# Patient Record
Sex: Female | Born: 1990 | Race: Black or African American | Hispanic: No | Marital: Single | Smoking: Never smoker
Health system: Southern US, Academic
[De-identification: ages and names within clinical notes are randomized; demographics above are authoritative.]

## PROBLEM LIST (undated history)

## (undated) DIAGNOSIS — N760 Acute vaginitis: Secondary | ICD-10-CM

## (undated) DIAGNOSIS — J45909 Unspecified asthma, uncomplicated: Secondary | ICD-10-CM

## (undated) DIAGNOSIS — Z789 Other specified health status: Secondary | ICD-10-CM

## (undated) HISTORY — DX: Other specified health status: Z78.9

## (undated) HISTORY — DX: Unspecified asthma, uncomplicated: J45.909

## (undated) HISTORY — PX: HX TONSILLECTOMY: SHX27

## (undated) HISTORY — DX: Acute vaginitis: N76.0

## (undated) HISTORY — PX: HX WISDOM TEETH EXTRACTION: SHX21

---

## 2011-08-03 ENCOUNTER — Ambulatory Visit (INDEPENDENT_AMBULATORY_CARE_PROVIDER_SITE_OTHER)

## 2011-08-03 ENCOUNTER — Encounter (INDEPENDENT_AMBULATORY_CARE_PROVIDER_SITE_OTHER): Payer: Self-pay

## 2011-08-03 VITALS — BP 133/93 | HR 86 | Temp 97.5°F | Resp 16 | Wt 274.9 lb

## 2011-08-03 MED ORDER — NYSTATIN-TRIAMCINOLONE 100,000 UNIT/GRAM-0.1 % TOPICAL OINTMENT
TOPICAL_OINTMENT | Freq: Two times a day (BID) | CUTANEOUS | Status: DC
Start: 2011-08-03 — End: 2011-09-02

## 2011-08-03 NOTE — Patient Instructions (Signed)
[facility]  [facility]  [address], [address]  [place], [place] [address]  Phone: [phone]  Fax: [phone]  [email or web address]  ________________________________________________________________________           Open Daily 8:00am - 8:00pm     ~     Closed Thanksgiving and Christmas Day  ________________________________________________________________________    Attending Caregiver:  Janeece Riggers. Aundria Rud, MD    Prescription(s) E-Rx to:  [facility] - [place], [place] - [address] AT [address]]    Orders Placed This Encounter   . Nystatin-Triamcinolone (MYCOLOG) 100,000-0.1 unit/gram-% Apply externally Ointment           Candidiasis, Fungal Infections,   Ringworm, Yeast Infections  Ringworm infection is a fungal infection. It usually infects the skin and mucous membranes (like the membranes that line the mouth). It has nothing to do with worms. A fungus is an organism (kind of like a germ) that lives on dead cells (the outer layer of skin, hair and nails). It does not go deeper than the surface of the skin. It can involve the entire body. It can spread from infected humans, soil and pets. The medical term for this fungus is Tinea. Tinea is a group of infections of the skin, nails or hair caused by fungal organisms. They often cause a red, round, itchy rash. Types are named by location and include an infection of the skin (Tinea versicolor), ringworm (Tinea corporis), athlete's foot (Tinea pedis), ringworm of the scalp (Tinea capitis) and jock itch (Tinea cruris).    Infections usually grow on our bodies where it is warm and moist. This is the reason it is found under the breasts, in skin folds and between the toes, among other places. This organism is normally found on our skin and only causes problems when it starts to overgrow in conditions it favors or likes. Candidiasis is commonly found in the vagina.  SYMPTOMS   Itching.    Rash.     Irritation.    Swelling.    Blisters.    Cracking and peeling of the skin.    Thick, dry, ragged and discolored toenails.   Intertriginous infections, like those infections under the breast and in skin folds, are the most common type. These also occur under the arms, and in the groin or belly button (umbilicus). The infection usually shows up as a red itchy rash which is often weepy and damp.  SOME OF THE CAUSES OF THESE INFECTIONS ARE:   Antibiotics. These kill the normal germs (bacteria) on the skin, so yeasts can overgrow.    Steroid use. This weakens (depresses) the immune response or the body's ability to fight infection.    Being very overweight (obese).    Poor immune state and problems with the immune system.    Immunosuppressive therapy. This includes cancer or organ transplant drugs.    Pregnancy.    Diabetes mellitus and other glandular problems or serious medical illness.    Going barefoot in public locker rooms, saunas and showers.    Contact from pets.    Contact from other infected humans.    Contact with infected soil.    AIDS.   DIAGNOSIS  Your caregiver can usually diagnose this infection on exam. Sometimes, scrapings of the skin are done to look for the cause under a microscope or to get cultures of the germ.  TREATMENT     Over-the-counter (OTC) medications are available for  treating this problem.    It is important to keep the areas clean and dry. Getting rid of the conditions which allowed the fungus to start growing is often a cure or fix.    Areas which have lost skin color (pigment) may not get color back until the fungus is clear.    For frequent or recurrent candidiasis infections of the vagina, use a anti fungus vaginal suppository or vaginal cream 2 times a week, or as directed.   HOME CARE INSTRUCTIONS    Fungal infections may be treated with over-the-counter medications, topical creams, ointments or oral medications. Ask your caregiver or pharmacist. Call your caregiver if your OTC treatment is not working.    If you are using a cream or ointment, wash infected skin and dry it completely before applying the cream.    If your pet has the same infection, have it treated by your veterinarian.    Do not wear other people's cloths or shoes.    Keep your feet, areas under your breasts, your belly button and groin areas dry.    Wear cotton or wool socks.    Do not wear tight clothing.    Change your socks often if your feet sweat.    Do not wear shoes made of vinyl or rubber.   The infection may return because the causative organism is normally found on the skin or the treatment was not strong or long enough.    SEEK MEDICAL CARE IF:   The ringworm patch (fungus) continues to spread after 7 days of treatment.    The rash is not gone in 4 weeks. Fungal infections are slow to respond to treatment. Some redness (erythema) may remain for several weeks after the fungus is gone.    The area becomes red, warm, tender and swollen beyond the patch. (This may be a secondary bacterial infection).    An oral temperature above 101 develops.    You develop painful urination.    You develop pain with sexual intercourse.    You develop blisters in the area of the infection.    You develop intestinal problems.    You develop vaginal bleeding and it is not time for your menstrual period.   MAKE SURE YOU:     Understand these instructions.    Will watch your condition.    Will get help right away if you are not doing well or get worse.   Document Released: 01/25/2004 Document Re-Released: 09/01/2009  [facility] Patient Information 2012 [place], [place].

## 2011-08-03 NOTE — Progress Notes (Signed)
History of Present Illness: Erin Russo is a 20 y.o. female who presents to the Urgent Care today complaining of a rash. It is under her right breast and started about a week ago.  Thought it was just irritated from her bra.  She has tried baby powder without improvement.  Seems to be getting worse.  She works out 2-3x/week.    I reviewed and confirmed the patient's past medical history taken by the nurse or medical assistant (see cosignature of written/scanned note) with the addition of the following:    Past Medical History:    Past Medical History   Diagnosis Date   . Asthma        Past Surgical History:   Past Surgical History   Procedure Date   . Hx tonsillectomy    . Hx wisdom teeth extraction        Allergies: No Known Allergies    Medications:   No outpatient prescriptions prior to visit.    Social History:  History     Social History   . Marital Status: Single     Spouse Name: N/A     Number of Children: N/A   . Years of Education: N/A     Social History Main Topics   . Smoking status: Never Smoker    . Smokeless tobacco: Not on file   . Alcohol Use: Not on file   . Drug Use: Not on file   . Sexually Active: Not on file     Other Topics Concern   . Not on file     Social History Narrative   . No narrative on file       Family History: No family history on file.    Review of Systems:    General: no fever  Skin: no other rash  Endo: has gained weight since stopped playing basketball. Having trouble getting it off.    Physical Exam:  Vital signs: Review on written chart.  See nurses note.  Filed Vitals:    08/03/11 1050   BP: 133/93   Pulse: 86   Temp: 36.4 C (97.5 F)   TempSrc: Tympanic   Resp: 16   Weight: 124.7 kg (274 lb 14.6 oz)   SpO2: 99%     General: Well-appearing. No apparent distress.  Eyes: Normal lids and lashes. Normal conjunctiva.  Skin: hyperpigmented, large patch under right breast.  Round and well demarcated.    Point-of-care testing: n/a     Differential diagnosis: tinea corporis    Assessment Plan:      1. Tinea corporis (110.5)      Orders Placed This Encounter   . Nystatin-Triamcinolone (MYCOLOG) 100,000-0.1 unit/gram-% Apply externally Ointment       Plan was discussed and patient verbalized understanding.  If symptoms are worsening or not improving the patient should return to the Urgent Care for further evaluation.    Janifer Adie, MD 08/03/2011, 11:07 AM

## 2011-08-27 ENCOUNTER — Ambulatory Visit (INDEPENDENT_AMBULATORY_CARE_PROVIDER_SITE_OTHER)

## 2011-08-27 ENCOUNTER — Encounter (INDEPENDENT_AMBULATORY_CARE_PROVIDER_SITE_OTHER): Payer: Self-pay

## 2011-08-27 VITALS — BP 138/85 | HR 84 | Temp 97.9°F | Resp 18 | Wt 274.5 lb

## 2011-08-27 NOTE — Patient Instructions (Signed)
 [facility]  [facility]  [address]  [place], [place] [address]  Phone: [phone]  Fax: [phone]  [email or web address]  ________________________________________________________________________           Open Daily 8:00am - 8:00pm     ~     Closed Thanksgiving and Christmas Day  ________________________________________________________________________    Attending Caregiver:  Oneil BRAVO. Sharl, MD    Prescription(s) E-Rx to:  [facility] - [place], [place] - [address] AT [address]]    Orders Placed This Encounter   . AMB CONSULT/REFERRAL PHYS THERAPY- EXTERNAL             Costochondritis  (Costochondral Separation / Tietze Syndrome)  Costochondritis (Tietze syndrome), or costochondral separation, is a swelling and irritation (inflammation) of the tissue (cartilage) that connects your ribs with your breastbone (sternum). It may occur on its own (spontaneously), through damage caused by an accident (trauma), or simply from coughing or minor exercise. It may take up to 6 weeks to get better and longer if you are unable to be conservative in your activities.  HOME CARE INSTRUCTIONS   Avoid exhausting physical activity. Try not to strain your ribs during normal activity. This would include any activities using chest, belly (abdominal) and side muscles, especially if heavy weights are used.    Use ice for 15 minutes per hour while awake for the first 2 days. Place the ice in a plastic bag, and place a towel between the bag of ice and your skin.    Only take over-the-counter or prescription medicines for pain, discomfort, or fever as directed by your caregiver.   SEEK IMMEDIATE MEDICAL CARE IF:   Your pain increases or you are very uncomfortable.    An oral temperature above 101 develops.    You develop difficulty with your breathing.    You cough up blood.    You develop worse chest pains, shortness of breath, sweating, or  vomiting.    You develop new, unexplained problems (symptoms).   MAKE SURE YOU:     Understand these instructions.    Will watch your condition.    Will get help right away if you are not doing well or get worse.   Document Released: 08/14/2005 Document Re-Released: 01/29/2010  [facility] Patient Information 2012 [place], [place].

## 2011-08-30 NOTE — Progress Notes (Signed)
History of Present Illness: Erin Russo is a 20 y.o. female who presents to the Urgent Care today complaining of twinges of pain intermittently, lasting only a few seconds and occuring infrequently in left chest wall below her left breast.  Always in the same spot.  Well localized to fingertip area of pain.    I reviewed and confirmed the patient's past medical history taken by the nurse or medical assistant with the addition of the following:    Past Medical History   Diagnosis Date   . Asthma      Past Surgical History   Procedure Date   . Hx tonsillectomy    . Hx wisdom teeth extraction      No Known Allergies    Outpatient Prescriptions Prior to Visit:  NORGESTIMATE-ETHINYL ESTRADIOL (ORTHO TRI-CYCLEN, 28, ORAL) take  by mouth.     Nystatin-Triamcinolone (MYCOLOG) 100,000-0.1 unit/gram-% Apply externally Ointment Apply  topically Twice daily.     History     Social History   . Marital Status: Single     Spouse Name: N/A     Number of Children: N/A   . Years of Education: N/A     Social History Main Topics   . Smoking status: Never Smoker    . Smokeless tobacco: Not on file   . Alcohol Use: Yes   . Drug Use: Not on file   . Sexually Active: Not on file     Other Topics Concern   . Not on file     Social History Narrative   . No narrative on file     Family History   Problem Relation Age of Onset   . Healthy Mother    . Healthy Father        Review of Systems:  Constitutional:  No fever  HEENT:  No st  Respiratory: no cough  Cardiovascular:  No palp  GI:  No n/v/d or abd pain  Skin:  Rash improved from prior visit.    All other review of systems are NEGATIVE.    Physical Exam:  Vital signs:   Filed Vitals:    08/27/11 1626   BP: 138/85   Pulse: 84   Temp: 36.6 C (97.9 F)   TempSrc: Tympanic   Resp: 18   Weight: 124.5 kg (274 lb 7.6 oz)   SpO2: 98%     Constitutional:  Well-appearing, in no apparent distress   ENT: Normal external auditory canals bilaterally. Normal tympanic membranes without erythema or effusion. Oropharynx is clear, no erythema or exudate.  Neck: Supple, no lymphadenopathy, no thyromegaly.  Pulmonary: Lungs are clear to auscultation bilaterally, no wheezes, no rales, no rhonchi.  Cardiovascular: Regular rate and rhythm, normal S1 and S2, no murmur, no rub, no gallop.  Chest wall:  Point tender over 5th rib costochondral joint on the left.     Medical Decision Making: costochondritis v subluxation     Assessment and Plan: PT refer, NSAIDs, ice.    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 AMB CONSULT/REFERRAL PHYS THERAPY- EXTERNAL       Plan was discussed and patient verbalized understanding.  If symptoms are worsening or not improving the patient should return to the Urgent Care for further evaluation.    Janifer Adie, MD 08/30/2011, 5:11 PM

## 2011-09-02 ENCOUNTER — Ambulatory Visit (INDEPENDENT_AMBULATORY_CARE_PROVIDER_SITE_OTHER)

## 2011-09-02 ENCOUNTER — Encounter (INDEPENDENT_AMBULATORY_CARE_PROVIDER_SITE_OTHER): Payer: Self-pay

## 2011-09-02 VITALS — BP 124/74 | HR 83 | Temp 97.3°F | Wt 270.5 lb

## 2011-09-02 MED ORDER — ALBUTEROL SULFATE HFA 90 MCG/ACTUATION AEROSOL INHALER
2.0000 | INHALATION_SPRAY | RESPIRATORY_TRACT | Status: AC | PRN
Start: 2011-09-02 — End: ?

## 2011-09-02 NOTE — Patient Instructions (Addendum)
 Asthma, Adult  Asthma is caused by narrowing of the air passages in the lungs. It may be triggered by pollen, dust, animal dander, molds, some foods, respiratory infections, exposure to smoke, exercise, emotional stress or other allergens (things that cause allergic reactions or allergies). Repeat attacks are common.  HOME CARE INSTRUCTIONS   Use prescription medications as ordered by your caregiver.    Avoid pollen, dust, animal dander, molds, smoke and other things that cause attacks at home and at work.    You may have fewer attacks if you decrease dust in your home. Electrostatic air cleaners may help.    It may help to replace your pillows or mattress with materials less likely to cause allergies.    Talk to your caregiver about an action plan for managing asthma attacks at home, including, the use of a peak flow meter which measures the severity of your asthma attack. An action plan can help minimize or stop the attack without having to seek medical care.    If you are not on a fluid restriction, drink 8 to 10 glasses of water each day.    Always have a plan prepared for seeking medical attention, including, calling your physician, accessing local emergency care, and calling 911 (in the [place] for a severe attack.    Discuss possible exercise routines with your caregiver.    If animal dander is the cause of asthma, you may need to get rid of pets.   SEEK MEDICAL CARE IF:   You have wheezing and shortness of breath even if taking medicine to prevent attacks.    An oral temperature above 101 F develops.    You have muscle aches, chest pain or thickening of sputum.    Your sputum changes from clear or white to yellow, green, gray or bloody.    You have any problems that may be related to the medicine you are taking (such as a rash, itching, swelling or trouble breathing).   SEEK IMMEDIATE MEDICAL CARE IF:   Your usual medicines do not stop your wheezing or there is increased coughing and/or  shortness of breath.    You have increased difficulty breathing.    You have an oral temperature above 101 F, not controlled by medicine.   MAKE SURE YOU:   Understand these instructions.    Will watch your condition.    Will get help right away if you are not doing well or get worse.   Document Released: 11/04/2005 Document Re-Released: 11/26/2009  [employer] Patient Information 2012 [place] BIJOU.       [facility]   [facility]  [address]  [place], [place] [address]  Phone: [phone]  Fax: [phone]  [email or web address]               Open Daily 8:00am - 8:00pm ~ Closed Thanksgiving and Christmas Day     Attending Caregiver: Raford, PA-C/ Loralee, MD    Today's orders:   Orders Placed This Encounter   . albuterol  sulfate (PROAIR  HFA) 90 mcg/actuation Inhalation HFA Aerosol Inhaler        Prescription(s) E-Rx to:  [facility] - [place], [place] - [address] AT [address]    ________________________________________________________________________  Short Term Disability and Family Medical Leave Act  [facility] does NOT provide assistance with any disability applications.  If you feel your medical condition requires you to be on disability, you will need to  follow up with  Your primary care physician or a specialist.  We apologize for any inconvenience.    For Medication Prescribed by [facility]:  As an Urgent Care facility, our clinic does NOT offer prescription refills over the telephone.    If you need more of the medication one of our medical providers prescribed, you will  Either need to be re-evaluated by us  or see your primary care physician.    ________________________________________________________________________      It is very important that we have a phone number that is the single best way to contact you in the event that we become aware of important clinical information or concerns after your  discharge.  If the phone number you provided at registration is NOT this number you should inform staff and registration prior to leaving.      Your treatment and evaluation today was focused on identifying and treating potentially emergent conditions based on your presenting signs, symptoms, and history.  The resulting initial clinical impression and treatment plan is not intended to be definitive or a substitute for a full physical examination and evaluation by your primary care provider.  If your symptoms persist, worsen, or you develop any new or concerning symptoms, you need to be evaluated.      If you received x-rays during your visit, be aware that the final and formal interpretation of those films by a radiologist may occur after your discharge.  If there is a significant discrepancy identified after your discharge, we will contact you at th telephone number provided at registration.      If you received a pelvic exam, you may have cultures pending for sexually transmitted diseases.  Positive cultures are reported to the [facility] Department of Health as required by state law.  You should be contacted if you cultures are positive.  We will not contact you if they are negative.  You may contact the Health Information Management Office of [facility] to get a copy of your results.  You did NOT receive a PAP smear (the screening test for cervical).  This specific test for women is best performed by your gynecologist or primary care provider when indicated.      If you are over 20 year old, we cannot discuss your personal health information with a parent, spouse, family member, or anyone else without your express consent.  This does not include those who have legitimate access to your records and information to assist in your care under the provisions of HIPAA [facility] Portability and Accountability Act) law, or those to whom you have previously given express written consent to do so, such a legal  guardian or Power of [place].      You may have received medication that may cause you to feel drowsy and/or light headed for several hours.  You may even experience some amnesia of your stay.  You should avoid operating a motor vehicle or performing any activity requiring complete alertness or coordination until you feel fully awake (approximately 24-48 hours).  Avoid alcoholic beverages.  You may also have a dry mouth for several hours.  This is a normal side effect and will disappear as the effects of the medication wear off.      Instructions discussed with patient upon discharge by clinical staff with all questions answered.  Please call [facility] [phone]) if any further questions.  Go immediately to the emergency department if any concern or worsening symptoms.  Caron Scarce, [place] 09/02/2011, 6:26 PM

## 2011-09-02 NOTE — Progress Notes (Signed)
[place] [facility]  [facility]  [address]  [place] [address]  [phone]      Name:  Teona Vargus  MRN:  [id number]  DOB:  11/02/1991  DATE:  09/02/2011    History of Present Illness: Bren Borys is a 20 y.o. female who presents to the Urgent Care today complaining of Asthma  .  She reports to urgent care today for a refill of her albuterol inhaler.  Patient denies any recent asthma attacks but is concerned with the weather turning colder that it will affect her breathing.  She denies any chest pain or SOB.  She [facility]er [facility].  At this time, she is currently without any symptoms.  She denies any smoking.  Patient reports that asthma is well controlled with albuterol inhaler.  She denies any other regular medications.    Location: respiratory  Quality: asthma  Onset: no acute symptoms  Severity: Mild  Timing: intermittent  Context: Patient denies any pain  Modifying factors: h/o asthma  Associated symptoms: no current symptoms    I reviewed and confirmed the patient's past medical history taken by the nurse or medical assistant with the addition of the following:    Past Medical History:  Past Medical History   Diagnosis Date   . Asthma        Past Surgical History:  Past Surgical History   Procedure Date   . Hx tonsillectomy    . Hx wisdom teeth extraction        Allergies:  No Known Allergies    Medications:    Outpatient Prescriptions Prior to Visit:  NORGESTIMATE-ETHINYL ESTRADIOL (ORTHO TRI-CYCLEN, 28, ORAL) take  by mouth.     Nystatin-Triamcinolone (MYCOLOG) 100,000-0.1 unit/gram-% Apply externally Ointment Apply  topically Twice daily.       Social History:  Occupation: Interior and spatial designer)    History   Substance Use Topics   . Smoking status: Never Smoker    . Smokeless tobacco: Not on file   . Alcohol Use: Yes       Family History:  Family History   Problem Relation Age of Onset    . Healthy Mother    . Healthy Father        Review of Systems:  General:  Denies any fever, myalgias or fatigue  Pulmonary: Denies cough, wheezing, shortness of breath, hemoptysis  Cardiovascular: Denies chest pain or palpatations  LMP: 3 weeks ago  Neuro:  Denies parasthesias, weakness, headache, LOC or seizures      Physical Exam:  Vital signs:   Filed Vitals:    09/02/11 1748   BP: 124/74   Pulse: 83   Temp: 36.3 C (97.3 F)   TempSrc: Tympanic   Weight: 122.7 kg (270 lb 8.1 oz)   SpO2: 97%       General:  Patient is well-appearing and no acute distress  Pulmonary: lungs clear to ausciltation bilaterally, no wheezes appreciated  CV: heart has a regular, rate and rhythm without murmur; normal S1/S2  Skin/ Wound: skin is warm and dry with no evidence of rash  Psych: appropriate mood and behavior for age  Neuro: patient is alert and oriented x 3    Data Reviewed  Not applicable    Course: Condition at discharge: Good     Differential Diagnosis: Asthma    Assessment:   1. Asthma        Plan:  Orders Placed This Encounter   . albuterol sulfate (PROAIR HFA) 90 mcg/actuation Inhalation HFA Aerosol Inhaler      Plan was discussed and patient verbalized understanding.  If symptoms are worsening or not improving the patient should return to the Urgent Care for further evaluation.     Patient given a script for Albuterol; encouraged her to monitor symptoms and return if any problems with breathing    Patient understood and agreed with plan; She understands to return to ED or UC if symptoms progress or get worse    The co-signing supervising physician was physically present in Urgent Care and available for consultation and did not participate in the care of this patient.    This patient was seen in clinic while Dr. Trudie Reed was the supervising physician.        Junie Bame, [place] 09/02/2011, 6:28 PM

## 2012-01-26 ENCOUNTER — Emergency Department
Admission: EM | Admit: 2012-01-26 | Discharge: 2012-01-27 | Disposition: A | Discharge status: Home / Self Care | Attending: Emergency Medicine | Admitting: Emergency Medicine

## 2012-01-26 ENCOUNTER — Encounter (HOSPITAL_COMMUNITY): Payer: Self-pay

## 2012-01-26 ENCOUNTER — Emergency Department (EMERGENCY_DEPARTMENT_HOSPITAL)

## 2012-01-26 DIAGNOSIS — X500XXA Overexertion from strenuous movement or load, initial encounter: Secondary | ICD-10-CM | POA: Insufficient documentation

## 2012-01-26 DIAGNOSIS — IMO0002 Reserved for concepts with insufficient information to code with codable children: Secondary | ICD-10-CM | POA: Insufficient documentation

## 2012-01-26 NOTE — ED Provider Notes (Signed)
 Patient is a 21 y.o. female presenting with knee pain. The history is provided by the patient.   Knee Pain  This is a new (after she was stretching before work-out.she felt  twisting of her left knee outwards . ) problem. The current episode started 1 to 2 hours ago (after she was stretching). The problem occurs constantly. The problem has not changed since onset.Pertinent negatives include no chest pain, no abdominal pain, no headaches and no shortness of breath. Nothing aggravates the symptoms. Nothing relieves the symptoms. She has tried nothing for the symptoms.   she dis not hear a pop sound. she started not to bare weight on it because she is afraid to hurt it more. It feels swollen and red. She denies falling on the left knee.    Review of Systems   Constitutional: Negative.    HENT: Negative.    Eyes: Negative.    Respiratory: Negative for cough and shortness of breath.    Cardiovascular: Negative for chest pain.   Gastrointestinal: Negative for abdominal pain.   Genitourinary: Negative.    Neurological: Negative for headaches.     Past Medical History   Diagnosis Date   . Asthma      Past Surgical History   Procedure Date   . Hx tonsillectomy    . Hx wisdom teeth extraction      No current facility-administered medications for this encounter.     Current Outpatient Prescriptions   Medication Sig   . albuterol  sulfate (PROAIR  HFA) 90 mcg/actuation Inhalation HFA Aerosol Inhaler take 2 Puffs by inhalation Every 4 hours as needed.   . NORGESTIMATE-ETHINYL ESTRADIOL (ORTHO TRI-CYCLEN, 28, ORAL) take  by mouth.       No Known Allergies    BP 120/66  Pulse 84  Temp 36.4 C (97.5 F)  Resp 18  Wt 108.863 kg (240 lb)  SpO2 100%  LMP 01/24/2012  Physical Exam   Constitutional: She is oriented to person, place, and time. She appears well-developed and well-nourished. No distress.   HENT:   Head: Normocephalic.   Eyes: Conjunctivae are normal. Pupils are equal, round, and reactive to light.   Neck: Neck  supple.   Cardiovascular: Normal rate, regular rhythm and normal heart sounds.    Pulmonary/Chest: Effort normal and breath sounds normal. No respiratory distress.   Abdominal: Soft. Bowel sounds are normal. She exhibits no distension. There is no tenderness.   Musculoskeletal:        Left knee: point tenderness on medial aspect of patella. No erythema, no swelling   Neurological: She is alert and oriented to person, place, and time.        Motor 5/5   Skin: She is not diaphoretic.       Faythe Heitzenrater is a 21 y.o. female with left knee pain secondary to twisiting.  Likely it is a muscle sprain/strain  Orders Placed This Encounter   . XR KNEE LEFT     Course  XR left knee: no evidence of fx  Ordered knee immobilizer  Informed patient to take OTC ibuprofen for pain  To follow at student health in 1 week    Amil Sprout, MD 01/26/2012, 11:57 PM

## 2012-01-26 NOTE — ED Attending Note (Signed)
Note begun by:  Odis Luster, MD 01/26/2012, 11:39 PM    I was physically present and directly supervised this patient's care.  Patient seen and examined with Dr. Joesph July, history and exam reviewed.   Key elements in addition to and/or correction of that documentation are as follows:    HPI :      21 y.o. female presents with chief complaint of 2 hours ago onset of left knee pain.  She was stretching and felt a twisting in the left knee, laterally  Not hurting but feels tight.  No fall, feels it is swollen and red.  No pop auscultated.  Not bearing weight because fear of hurting more    Past Medical History:  Past Medical History   Diagnosis Date   . Asthma        Past Surgical History:  Past Surgical History   Procedure Date   . Hx tonsillectomy    . Hx wisdom teeth extraction        Social History:  History   Substance Use Topics   . Smoking status: Never Smoker    . Smokeless tobacco: Not on file   . Alcohol Use: Yes     History   Drug Use No       Family History:  Family History   Problem Relation Age of Onset   . Healthy Mother    . Healthy Father        PE :     ED Triage Vitals   Enc Vitals Group      BP (Non-Invasive) 01/26/12 2239 120/66 mmHg      Heart Rate 01/26/12 2239 84       Respiratory Rate 01/26/12 2239 18       Temperature 01/26/12 2239 36.4 C (97.5 F)      Temp src --       SpO2-1 01/26/12 2239 100 %      Weight 01/26/12 2239 108.863 kg (240 lb)      Height --       Head Cir --       Peak Flow --       Pain Score --       Pain Loc --       Pain Edu? --       Excl. in GC? --      No erythema or warmth of the left knee  Point tenderness medial  And inferior to aspect of the patella  5/5 strength.  No effusion  Alert and oriented  Skin warm and dry  Mucous membranes moist  Neck supple  Lungs clear  Heart RRR  Abdomen NT, soft, normal bowel sounds  Nl pulses  Moves all extremities well  Sensation grossly intact    Data/Test :    EKG : None  Images Review by me :      Image Reports Review by me : As above  Left knee  No evidence of acute osseous injury. Question tiny knee joint effusion  Labs : None    Review of Prior Data :       Prior Images : None  Prior EKG : None  Online Medical Records : None  Transfer Docs/Images : None    Clinical Impression :     1. Knee sprain and strain      MDM & ED Course & Plan :     Orders Placed This Encounter   . XR KNEE LEFT  Able to get the patient up to walk, she was very tentative using the left knee.  Patient states that in the past she required a knee brace.  Will give her a brace.  Ice/NSAIDS  Follow up with student health    Dispo :     Discharge to home  CRITICAL CARE : None    This note was prepared in a retrospective fashion given the demands for hands-on patient care at the time of the patient encounter.

## 2012-01-27 NOTE — ED Nurses Note (Signed)
Knee immobilizer placed.  Discharged as written, no questions or concerns.  Ambulatory from department under own power.

## 2012-02-03 ENCOUNTER — Encounter (INDEPENDENT_AMBULATORY_CARE_PROVIDER_SITE_OTHER): Payer: Self-pay

## 2012-02-03 ENCOUNTER — Ambulatory Visit (INDEPENDENT_AMBULATORY_CARE_PROVIDER_SITE_OTHER): Admitting: Family Medicine

## 2012-02-03 VITALS — BP 123/76 | HR 83 | Temp 97.8°F | Ht 64.0 in | Wt 274.0 lb

## 2012-02-03 NOTE — Progress Notes (Signed)
[facility]    Patient Name:  Erin Russo  MRN:  [id number]  DOB:  [birth year]  Date of Service: 02/03/2012    Chief Complaint   Patient presents with   . ED Follow-up     History of Present Illness: Erin Russo is a 21 y.o. female who presents to [facility] today for the above complaint.    Twisted left knee on 3/10 in evening. Went to [employer] and was given a  Straight leg brace. Neg film and Dx with sprain.  She has not bend her knee since then.    She was working out and stopped to stretch and did a side lunge with knee twisting. Had pain immediately in inner knee joint and she fell to floor  Pain level now is 1/10. She is using IB occasionally but none today.  In past she did same type of injury at age 45.    Denies any swelling now.      Past Medical History:   Past Medical History:    Past Medical History   Diagnosis Date   . Asthma      Past Surgical History:    Past Surgical History   Procedure Date   . Hx tonsillectomy    . Hx wisdom teeth extraction      Allergies:  No Known Allergies  Medications:  Outpatient Prescriptions Marked as Taking for the 02/03/12 encounter (Office Visit) with Hartman-Adams, Burnice Logan, MD   Medication Sig   . NORGESTIMATE-ETHINYL ESTRADIOL (ORTHO TRI-CYCLEN, 28, ORAL) take  by mouth.        Social History:    History     Social History   . Marital Status: Single     Spouse Name: N/A     Number of Children: N/A   . Years of Education: N/A     Occupational History   . student      Social History Main Topics   . Smoking status: Never Smoker    . Smokeless tobacco: Not on file   . Alcohol Use: Yes   . Drug Use: No   . Sexually Active: Not on file     Other Topics Concern   . 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 Healthy Mother    . Healthy Father       Review of Systems:ROS is as per HPI, Cardiovascular: negative and Gastrointestinal: negative  Physical Exam:  Vital signs: BP 123/76   Pulse 83   Temp 36.6 C (97.8 F)   Ht 1.626 m (5\' 4" )   Wt 124.286 kg (274 lb)   BMI 47.03 kg/m2   LMP 01/24/2012  General: appears in good health and morbidly obese  Musculoskeletal exam:   Left Knee Exam antalgic gait  Due to stiffness and fear of pain. Able to walk with mild knee bend without pain in office out of brace.  Mild soft tissue tenderness over quad insertion and lateral joint line. Quad pain is related to stretch pain as I manual flex knee. She was able to bend to 45 degrees with no joint line pain  Unable to perform lachman's due to fear of flexion. No pain with pivot shift on left or right joint line    No effusion but limit  due to size of legs  No posture tenderness  neg grind test    Data Reviewed:  No labs or x-rays performed today.    Assessment:   1. Knee strain      Plan:    Orders Placed This Encounter   . [facility] Referral-External     Physical Therapy ordered ace wrap instead of brace. No exercis until seen by Pt  She may use ace wrap for look for a hinged brace if she wishes.  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 This is more reason to go to PT  She agrees  IB as needed and ice after activity    Return in about 3 weeks (around 02/24/2012).    Gemma Payor, MD

## 2012-02-25 ENCOUNTER — Encounter (INDEPENDENT_AMBULATORY_CARE_PROVIDER_SITE_OTHER): Payer: Self-pay | Admitting: Physician Assistant

## 2012-02-25 ENCOUNTER — Encounter (INDEPENDENT_AMBULATORY_CARE_PROVIDER_SITE_OTHER): Payer: Self-pay | Admitting: Family Medicine

## 2012-02-25 ENCOUNTER — Ambulatory Visit (INDEPENDENT_AMBULATORY_CARE_PROVIDER_SITE_OTHER): Admitting: Physician Assistant

## 2012-02-25 VITALS — BP 120/82 | HR 77 | Temp 98.2°F | Resp 20 | Ht 64.0 in | Wt 275.0 lb

## 2012-02-25 NOTE — Patient Instructions (Signed)
-----------------------------  PRIVACY INFORMATION-----------------------------------------------  As a [place] student, regardless of your age, we cannot discuss your personal health information (with a parent, spouse, family member or anyone else) without your expressed consent.    This policy does not include:  Individuals who would have a legitimate reason to access your records and information to assist in your care under the provisions of HIPAA (Health Insurance Portability and Accountability Act) law;   Individuals with whom you have previously [place] expressed written consent to do so, such a legal guardian or Power of Attorney.    No one can access your [facility], unless you give them your account sign on and password. You will receive emails from [facility].  This means anyone who has access to the email account you provided can see this notification. There will be no private medical information included in these emails. This notification of  new medical information  available in your [facility], may be information that you do not want others to know.   _______________________________________________________________________    If your symptoms persist, worsen or you develop any new or concerning symptoms please call [facility] at [phone] for a follow up appointment.  If [facility] is closed,  you may go to an Urgent care. Please check with your health insurance coverage for these types of visits.  If your symptoms are severe, go immediately to the emergency department or call 911.   If you received x-rays during your visit, be aware that the final and formal interpretation of those films by a radiologist will occur after your discharge.  If there is a significant discrepancy identified after your discharge, we will contact you at the telephone number provided during registration.  These results are available for your review on [facility].   Please refer to your [facility] for lab test results. Lab test results related to HIV, STI's and pregnancy are considered private and are therefore not visible in your [facility].   If you have cultures pending for sexually transmitted diseases, you will be contacted by phone if your cultures are positive.  We will not contact you if the results are negative.   Positive cultures are reported to the [place] Department of Health, as required by state law. These results are considered private on [facility] and can only be released by the provider.  Please call [facility] at [phone] with any further questions.

## 2012-02-25 NOTE — Progress Notes (Addendum)
[facility]    PATIENT NAME:  Erin Russo  MRN:  [id number]  DOB:  01-18-1991  DATE OF SERVICE: 02/25/2012    Chief Complaint   Patient presents with   . Knee Sprain     follow-up left knee       History of Present Illness: Erin Russo is a 21 y.o. female who presents to [facility] today for the above complaint.  HPI    See note 02/03/12, here today for follow up   Knee injury 01/26/12: pain and mobility are improved, almost resolved   Went to [employer] yesterday: unable to do all the movements with left knee due to pain.   Current pain is only with going down stairs and with full flexion.   No pain at rest. No swelling, no warmth, no erythema.   No meds for pain currently   Didn't go to PT, states no one called her with appointment information.   Would like to go to PT, needs a new referral    Past Medical History:    Past Medical History   Diagnosis Date   . Asthma      Past Surgical History:    Past Surgical History   Procedure Date   . Hx tonsillectomy    . Hx wisdom teeth extraction      Allergies:  No Known Allergies  Medications:  Outpatient Prescriptions Marked as Taking for the 02/25/12 encounter (Office Visit) with Henderson Cloud, PA-C   Medication Sig   . albuterol sulfate (PROAIR HFA) 90 mcg/actuation Inhalation HFA Aerosol Inhaler take 2 Puffs by inhalation Every 4 hours as needed.   . NORGESTIMATE-ETHINYL ESTRADIOL (ORTHO TRI-CYCLEN, 28, ORAL) take  by mouth.       Social History:    History   Substance Use Topics   . Smoking status: Never Smoker    . Smokeless tobacco: Not on file   . Alcohol Use: Yes      Family History:  Family History   Problem Relation Age of Onset   . Healthy Mother    . Healthy Father      Review of Systems:  Review of Systems   Constitutional: Negative for fever and chills.   Gastrointestinal: Negative for nausea, vomiting and diarrhea.   Musculoskeletal: Positive for joint pain and falls.     Physical Exam:   BP 120/82   Pulse 77   Temp 36.8 C (98.2 F)   Resp 20   Ht 1.626 m (5\' 4" )   Wt 124.739 kg (275 lb)   BMI 47.20 kg/m2   SpO2 99%   LMP 01/27/2012  Physical Exam   Constitutional: She appears well-developed and well-nourished.        Morbid obesity   Ortho/Musculoskeletal:            Left Knee Exam   She exhibits normal range of motion, no swelling, no effusion, no ecchymosis, no deformity, no laceration, normal alignment and normal patellar mobility.     Nursing note and vitals reviewed.    .  Data Reviewed:  No labs or x-rays performed today.    Assessment:   1. Knee strain      Plan:    Orders Placed This Encounter   . [facility] Referral-External     Physical Therapy ordered Follow up sooner if not improving as discussed  Ice after activity.   Return if symptoms worsen or fail to improve.  Henderson Cloud,  PA-C

## 2012-09-21 ENCOUNTER — Encounter (INDEPENDENT_AMBULATORY_CARE_PROVIDER_SITE_OTHER): Admitting: Internal Medicine

## 2012-09-21 ENCOUNTER — Encounter (INDEPENDENT_AMBULATORY_CARE_PROVIDER_SITE_OTHER): Payer: Self-pay | Admitting: Internal Medicine

## 2012-09-21 ENCOUNTER — Ambulatory Visit (INDEPENDENT_AMBULATORY_CARE_PROVIDER_SITE_OTHER): Admitting: Internal Medicine

## 2012-09-21 VITALS — BP 130/80 | HR 68 | Temp 98.2°F | Resp 16 | Ht 64.0 in | Wt 262.0 lb

## 2012-09-21 NOTE — Patient Instructions (Signed)
 [facility]    PATIENT NAME:  Erin Russo  MRN:  [id number]  DOB:  11/05/1991  DATE OF SERVICE: 09/21/2012    Patient Discharge Instructions    IMMUNIZATION  You should remain 20 minutes in the clinic after the injection is administered.  If you experience a rash, hives or shortness of breath, return to the Nurse's Station immediately.    The injection site may be reddened, warm to touch and/or slightly painful.  Cool compress may help.  Tylenol  may be taken according to package directions.    Fever usually low grade fever for next 24 hours.    If you should have more severe reaction such as high fever, difficulty breathing or any serious allergic reaction, contact your provider or call 911 immediately.    PPD  ** If you have had a PPD please schedule a follow up appointment in 48-72 hours for the PPD reading.
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
## 2012-09-21 NOTE — Progress Notes (Signed)
Patient Name: Erin Russo  MRN# [id number]  DOB: 1991/09/09      Leeann Must is here today for administration/placement of PPD.  BP 130/80   Pulse 68   Temp(Src) 36.8 C (98.2 F)   Resp 16   Ht 1.626 m (5\' 4" )   Wt 118.842 kg (262 lb)   BMI 44.95 kg/m2   LMP 08/21/2012  Patient's last menstrual period was 08/21/2012.  She was instructed to  please remain 20 minutes after the injection and to report a reaction to the nurse as soon as she suspects it.  She was also given written instructions.  Mahalia Longest 09/21/2012, 10:19 AM        ** Patient was also instructed that if a PPD was placed to please schedule a follow up appointment in 48-72 hours for the PPD reading.

## 2012-09-23 ENCOUNTER — Ambulatory Visit (INDEPENDENT_AMBULATORY_CARE_PROVIDER_SITE_OTHER)

## 2012-09-23 NOTE — Progress Notes (Signed)
Erin Russo returns to the [facility] for reading of the PPD. PPD reading was negative with 0 mm induration . Documentation of PPD results given to patient.   Erin Russo 09/23/2012, 1:25 PM

## 2014-04-09 ENCOUNTER — Emergency Department
Admission: EM | Admit: 2014-04-09 | Discharge: 2014-04-09 | Disposition: A | Discharge status: Home / Self Care | Payer: MEDICAID | Attending: Emergency Medicine | Admitting: Emergency Medicine

## 2014-04-09 DIAGNOSIS — L0231 Cutaneous abscess of buttock: Secondary | ICD-10-CM | POA: Insufficient documentation

## 2014-04-09 DIAGNOSIS — J45909 Unspecified asthma, uncomplicated: Secondary | ICD-10-CM | POA: Insufficient documentation

## 2014-04-09 DIAGNOSIS — L0291 Cutaneous abscess, unspecified: Secondary | ICD-10-CM

## 2014-04-09 DIAGNOSIS — L03317 Cellulitis of buttock: Principal | ICD-10-CM | POA: Insufficient documentation

## 2014-04-09 MED ORDER — HYDROCODONE 5 MG-ACETAMINOPHEN 325 MG TABLET
1.00 | ORAL_TABLET | ORAL | Status: DC | PRN
Start: 2014-04-09 — End: 2016-04-26

## 2014-04-09 MED ORDER — SULFAMETHOXAZOLE 800 MG-TRIMETHOPRIM 160 MG TABLET
1.00 | ORAL_TABLET | Freq: Two times a day (BID) | ORAL | Status: DC
Start: 2014-04-09 — End: 2016-04-26

## 2014-04-09 MED ORDER — LIDOCAINE 20 MG/ML (2 %)-EPINEPHRINE 1:100,000 INJECTION SOLUTION
INTRAMUSCULAR | Status: AC
Start: 2014-04-09 — End: 2014-04-09
  Administered 2014-04-09: 300 mg via INTRADERMAL
  Filled 2014-04-09: qty 20

## 2014-04-09 MED ORDER — SULFAMETHOXAZOLE 800 MG-TRIMETHOPRIM 160 MG TABLET
1.00 | ORAL_TABLET | ORAL | Status: AC
Start: 2014-04-10 — End: 2014-04-09
  Administered 2014-04-09: 160 mg via ORAL
  Filled 2014-04-09: qty 1

## 2014-04-09 MED ORDER — HYDROCODONE 5 MG-ACETAMINOPHEN 325 MG TABLET
2.00 | ORAL_TABLET | ORAL | Status: AC
Start: 2014-04-10 — End: 2014-04-09
  Administered 2014-04-09: 2 via ORAL
  Filled 2014-04-09: qty 2

## 2014-04-09 MED ORDER — LIDOCAINE 20 MG/ML (2 %)-EPINEPHRINE 1:100,000 INJECTION SOLUTION
15.00 mL | INTRAMUSCULAR | Status: AC
Start: 2014-04-10 — End: 2014-04-09

## 2014-04-09 NOTE — ED Nurses Note (Signed)
Patient discharged home with family.  AVS reviewed with patient/care giver.  A written copy of the AVS and discharge instructions was given to the patient/care giver.  Questions sufficiently answered as needed.  Patient/care giver encouraged to follow up with PCP as indicated.  In the event of an emergency, patient/care giver instructed to call 911 or go to the nearest emergency room.     Two prescriptions given to pt with inst for use; Bactrim, Hydrocodone. To f/u with Ed tomorrow or with urgent care Monday.

## 2014-04-09 NOTE — Discharge Instructions (Signed)
SITZ BATHS.   NO TYLENOL ALCOHOL OR DRIVING WITH IN 6 HOURS OF MEDICATIONS   RETURN HERE IF WORSENS   FOLLOW UP WITH A SURGEON

## 2014-04-09 NOTE — ED Nurses Note (Signed)
WAITING ON PROVIDER TO BE AVAILABLE FOR I&D. PT AWARE.

## 2014-04-09 NOTE — ED Nurses Note (Signed)
SUTURE TRAY SET UP AT BEDSIDE AND PROVIDER AWARE.

## 2014-04-09 NOTE — ED Nurses Note (Signed)
I&D PREFORMED BY PROVIDER. PT TOL WELL. ONCE CONT PIECE OF 1/4 PACKING INSERTED INTO WOUND. SM AMT SEROUS DRAINAGE SUCTIONED FROM AREA DURING PROCEDURE. PT TOL WELL. INST GIVEN FOR SITZ BATH AND F/U.
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
## 2014-04-09 NOTE — ED Nurses Note (Signed)
abscess noted to buttocks to R buttock underneath near back of groin

## 2014-04-14 NOTE — Progress Notes (Signed)
Quick Note:    On Bactrim. No sensitivity noted. Spoke with pt who reports she is doing much better.  ______

## 2014-04-15 LAB — WOUND/ABSCESS CULT-AEROBIC - BMC/JMC ONLY
AMPICILLIN: <=0.25 — AB
CEFOTAXIME: <=0.12 — AB
CLINDAMYCIN: <=0.25 — AB
ERYTHROMYCIN: <=0.12 — AB
LEVOFLOXACIN: 1 — AB
LINEZOLID: <=2 — AB
LINEZOLID: <=2 — AB
PENICILLIN: <=0.06 — AB
TETRACYCLINE: >=16 — AB
VANCOMYCIN: 0.5 — AB
VANCOMYCIN: 0.5 — AB

## 2016-04-25 ENCOUNTER — Other Ambulatory Visit (HOSPITAL_BASED_OUTPATIENT_CLINIC_OR_DEPARTMENT_OTHER): Payer: Self-pay | Admitting: PHYSICIAN ASSISTANT

## 2016-04-25 DIAGNOSIS — J45909 Unspecified asthma, uncomplicated: Secondary | ICD-10-CM

## 2016-04-26 ENCOUNTER — Ambulatory Visit (INDEPENDENT_AMBULATORY_CARE_PROVIDER_SITE_OTHER): Payer: BC Managed Care – PPO | Admitting: Women's Health

## 2016-04-26 ENCOUNTER — Encounter (INDEPENDENT_AMBULATORY_CARE_PROVIDER_SITE_OTHER): Payer: Self-pay | Admitting: Women's Health

## 2016-04-26 VITALS — BP 118/78 | Ht 65.0 in | Wt 315.0 lb

## 2016-04-26 DIAGNOSIS — D259 Leiomyoma of uterus, unspecified: Secondary | ICD-10-CM

## 2016-04-26 DIAGNOSIS — N920 Excessive and frequent menstruation with regular cycle: Principal | ICD-10-CM

## 2016-04-26 DIAGNOSIS — Z6841 Body Mass Index (BMI) 40.0 and over, adult: Secondary | ICD-10-CM

## 2016-04-26 NOTE — Progress Notes (Addendum)
[facility]  [address]  [place] [address]  [facility]    Erin Russo  Date of Service: 04/26/2016    Chief Complaint:   Chief Complaint   Patient presents with    Establish Care    Irregular Bleeding       History  HPI    Patient into office for new patient problem exam.  Menarche @9  x 28 x 4-5 days heavy flows with clots.  Patient was seen in the ER on 04/18/16 for heavy bleeding x 4 weeks that wouldn't stop.  Had a pelvic ultrasound downstairs showing a fibroid in her uterus and they gave her DMPA.  Menses has since stopped, but patient doesn't want to take DMPA.  Patient uses condoms for contraception when needed.  Obtain records from ER    Review of Systems   Constitutional: Positive for fatigue.   HENT: Negative.    Eyes: Negative.    Respiratory: Negative.    Cardiovascular: Negative.    Gastrointestinal: Negative.    Endocrine: Negative.    Genitourinary: Positive for menstrual problem and vaginal bleeding.   Musculoskeletal: Negative.    Skin: Negative.    Allergic/Immunologic: Negative.    Neurological: Negative.    Hematological: Negative.    Psychiatric/Behavioral: Negative.        Examination  Vitals: BP 118/78   Ht 1.651 m (5\' 5" )   Wt (!) 142.9 kg (315 lb)   LMP 03/25/2016   Breastfeeding? No   BMI 52.42 kg/m2  Physical Exam   Constitutional: She is oriented to person, place, and time. She appears well-developed and well-nourished.   HENT:   Head: Normocephalic.   Neck: Normal range of motion.   Abdominal: Soft. There is no tenderness.   Genitourinary: Vagina normal and uterus normal. Pelvic exam was performed with patient supine. There is no lesion on the right labia. There is no lesion on the left labia. Cervix exhibits no motion tenderness. Right adnexum displays no mass, no tenderness and no fullness. Left adnexum displays no mass, no tenderness and no fullness. No vaginal discharge found.   Musculoskeletal: Normal range of motion.   Lymphadenopathy:         Right: No inguinal adenopathy present.        Left: No inguinal adenopathy present.   Neurological: She is alert and oriented to person, place, and time.   Skin: Skin is warm and dry.   Psychiatric: She has a normal mood and affect. Her behavior is normal. Judgment and thought content normal.   Vitals reviewed.    Ortho Exam    Results    Diagnosis and Plan  1. Menorrhagia    2. Fibroid uterus              Recheck sono in 6 weeks to assess size of fibroid              Olam Idler, NP

## 2016-06-07 ENCOUNTER — Ambulatory Visit (INDEPENDENT_AMBULATORY_CARE_PROVIDER_SITE_OTHER): Payer: BC Managed Care – PPO

## 2016-06-07 DIAGNOSIS — D259 Leiomyoma of uterus, unspecified: Secondary | ICD-10-CM

## 2016-06-07 DIAGNOSIS — N920 Excessive and frequent menstruation with regular cycle: Secondary | ICD-10-CM

## 2016-06-07 NOTE — Procedures (Signed)
See ultrasound report.

## 2016-09-12 ENCOUNTER — Ambulatory Visit (INDEPENDENT_AMBULATORY_CARE_PROVIDER_SITE_OTHER): Payer: BC Managed Care – PPO | Admitting: Women's Health

## 2016-09-12 ENCOUNTER — Encounter (INDEPENDENT_AMBULATORY_CARE_PROVIDER_SITE_OTHER): Payer: Self-pay | Admitting: Women's Health

## 2016-09-12 VITALS — BP 122/84 | Ht 65.0 in | Wt 308.0 lb

## 2016-09-12 DIAGNOSIS — Z01419 Encounter for gynecological examination (general) (routine) without abnormal findings: Secondary | ICD-10-CM

## 2016-09-12 DIAGNOSIS — N921 Excessive and frequent menstruation with irregular cycle: Secondary | ICD-10-CM

## 2016-09-12 DIAGNOSIS — N926 Irregular menstruation, unspecified: Secondary | ICD-10-CM

## 2016-09-12 LAB — PC POCT URINE PREG TEST, VISUAL: PREGNANCY, URINE: NEGATIVE

## 2016-09-12 NOTE — Progress Notes (Signed)
[facility]  [place] [address]  [facility]    Erin Russo  Date of Service: 09/12/2016    Chief Complaint:   Chief Complaint   Patient presents with    Annual Pap     no assistance       History  HPI    Patient into office for annual exam.  She is joining the [employer] and leaves in 01/2017 for [place].  She states in she received on DMPA injection in 02/2016 for heavy bleeding.  She states it did stop her bleeding but she started bleeding 06/14/16 and hasn't stopped bleeding.  She had an episode in 05/2016 that brought her to her knees with pain and cramping and then she passed a huge piece of tissue and the pain resolved but she continued to bleed.  She had a follow up ultrasound in our office that showed three small fibroids and endometrial lining was 1.9 cm and she had been bleeding for a month.  Patient is morbidly obese, so I discussed the need for EMB with patient and she agrees.  She doesn't want to use any king of birth control any more.  UPT negative.  Consents reviewed and signed.      Review of Systems   Constitutional: Negative.    HENT: Negative.    Eyes: Negative.    Respiratory: Negative.    Cardiovascular: Negative.    Gastrointestinal: Negative.    Endocrine: Negative.    Genitourinary: Positive for menstrual problem.   Musculoskeletal: Negative.    Skin: Negative.    Allergic/Immunologic: Negative.    Neurological: Negative.    Hematological: Negative.    Psychiatric/Behavioral: Negative.        Examination  Vitals: BP 122/84   Ht 1.651 m (5\' 5" )   Wt (!) 139.7 kg (308 lb)   LMP 06/14/2016   Breastfeeding? No   BMI 51.25 kg/m2  Physical Exam   Constitutional: She is oriented to person, place, and time. She appears well-developed and well-nourished.   HENT:   Head: Normocephalic.   Neck: Normal range of motion. No thyromegaly present.   Cardiovascular: Normal rate, regular rhythm and normal heart sounds.    No murmur heard.  Pulmonary/Chest:  Effort normal and breath sounds normal. Right breast exhibits no mass, no nipple discharge, no skin change and no tenderness. Left breast exhibits no mass, no nipple discharge, no skin change and no tenderness.   Abdominal: Soft. There is no tenderness.   Genitourinary: Uterus normal. Pelvic exam was performed with patient supine. There is no lesion on the right labia. There is no lesion on the left labia. Cervix exhibits no motion tenderness. Right adnexum displays no mass, no tenderness and no fullness. Left adnexum displays no mass, no tenderness and no fullness. There is bleeding in the vagina.   Genitourinary Comments: Cervix cleaned with betadine, EMB done and specimen sent to lab.     Musculoskeletal: Normal range of motion.   Lymphadenopathy:        Right: No inguinal adenopathy present.        Left: No inguinal adenopathy present.   Neurological: She is alert and oriented to person, place, and time.   Skin: Skin is warm and dry.   Psychiatric: She has a normal mood and affect. Her behavior is normal. Judgment and thought content normal.   Vitals reviewed.    Ortho Exam    Results    Diagnosis  and Plan  1. Well woman exam    2. Irregular bleeding    3. Menorrhagia with irregular cycle      Orders Placed This Encounter    ENDOMETRIAL BIOPSY (PIPELLE) (AMB ONLY)    CHLAMYDIA AND NEISSERIA GONORRHOEAE BY TMA - HIE ONLY    POCT URINE PREG TEST, VISUAL    CYTOPATHOLOGY-GYN (PAP AND HPV TESTS)       Olam Idler, NP

## 2016-09-16 ENCOUNTER — Other Ambulatory Visit (INDEPENDENT_AMBULATORY_CARE_PROVIDER_SITE_OTHER): Payer: Self-pay | Admitting: Women's Health

## 2016-09-18 ENCOUNTER — Other Ambulatory Visit (INDEPENDENT_AMBULATORY_CARE_PROVIDER_SITE_OTHER): Payer: Self-pay | Admitting: Women's Health

## 2016-09-18 DIAGNOSIS — Z01419 Encounter for gynecological examination (general) (routine) without abnormal findings: Secondary | ICD-10-CM

## 2016-09-23 ENCOUNTER — Encounter (INDEPENDENT_AMBULATORY_CARE_PROVIDER_SITE_OTHER): Payer: Self-pay | Admitting: Women's Health

## 2016-10-19 ENCOUNTER — Other Ambulatory Visit: Payer: Self-pay

## 2016-11-19 ENCOUNTER — Encounter (INDEPENDENT_AMBULATORY_CARE_PROVIDER_SITE_OTHER): Payer: Self-pay | Admitting: Women's Health

## 2016-11-25 ENCOUNTER — Encounter (INDEPENDENT_AMBULATORY_CARE_PROVIDER_SITE_OTHER): Payer: Self-pay | Admitting: Women's Health

## 2017-12-03 ENCOUNTER — Ambulatory Visit (INDEPENDENT_AMBULATORY_CARE_PROVIDER_SITE_OTHER): Payer: Self-pay | Admitting: Family

## 2017-12-04 ENCOUNTER — Ambulatory Visit (INDEPENDENT_AMBULATORY_CARE_PROVIDER_SITE_OTHER): Payer: PRIVATE HEALTH INSURANCE | Admitting: Family

## 2017-12-04 ENCOUNTER — Other Ambulatory Visit
Admission: RE | Admit: 2017-12-04 | Discharge: 2017-12-04 | Disposition: A | Discharge status: Home / Self Care | Payer: Self-pay | Source: Ambulatory Visit | Attending: Family | Admitting: Family

## 2017-12-04 ENCOUNTER — Encounter (INDEPENDENT_AMBULATORY_CARE_PROVIDER_SITE_OTHER): Payer: Self-pay | Admitting: Family

## 2017-12-04 VITALS — BP 124/69 | HR 67 | Temp 97.9°F | Resp 18 | Ht 65.0 in | Wt 305.0 lb

## 2017-12-04 DIAGNOSIS — Z114 Encounter for screening for human immunodeficiency virus [HIV]: Secondary | ICD-10-CM

## 2017-12-04 LAB — HIV AG/AB 4TH GENERATION: HIV Ag/Ab, 4th Generation: NONREACTIVE
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
## 2017-12-04 NOTE — Progress Notes (Signed)
Date Specimen Drawn:  12/04/2017   Time Specimen Drawn:  8:58 AM   Test(s) Ordered:  HIV   Disposition:  n/a   Patient's Tolerance:  Good   Location Specimen Drawn:  Left antecubital

## 2017-12-12 ENCOUNTER — Encounter (INDEPENDENT_AMBULATORY_CARE_PROVIDER_SITE_OTHER): Payer: Self-pay | Admitting: Family

## 2019-02-10 ENCOUNTER — Encounter (INDEPENDENT_AMBULATORY_CARE_PROVIDER_SITE_OTHER): Payer: Self-pay

## 2019-02-10 ENCOUNTER — Ambulatory Visit (INDEPENDENT_AMBULATORY_CARE_PROVIDER_SITE_OTHER): Payer: PRIVATE HEALTH INSURANCE | Admitting: Physician Assistant

## 2019-02-10 ENCOUNTER — Ambulatory Visit (INDEPENDENT_AMBULATORY_CARE_PROVIDER_SITE_OTHER): Payer: PRIVATE HEALTH INSURANCE | Attending: Physician Assistant

## 2019-02-10 DIAGNOSIS — M25511 Pain in right shoulder: Secondary | ICD-10-CM

## 2019-02-10 DIAGNOSIS — Y92410 Unspecified street and highway as the place of occurrence of the external cause: Secondary | ICD-10-CM

## 2019-02-10 NOTE — Patient Instructions (Signed)
Shoulder Pain with Uncertain Cause  Shoulder pain can have many causes. Pain often comes from the structures that surround the shoulder joint. These are the joint capsule, ligaments, tendons, muscles, and bursa. Pain can also come from cartilage in the joint. Cartilage can become worn out or injured. It's important to know what's causing your pain so the healthcare provider can use the correct treatment. But sometimes it's difficult to find the exact cause of shoulder pain. You may need to see a specialist (orthopedist). You may also need special tests such as a CT scan or MRI. The provider may need to use special tools to look inside the joint (arthroscopy).  Shoulder pain can be treated with a sling or a device that keeps your shoulder from moving. You can take an anti-inflammatory medicine such as ibuprofen to ease pain. You may need to do special shoulder exercises. Follow up with a specialist if the pain is severe or doesn't go away after a few weeks.  Home care  Follow these tips when caring for yourself at home:   If a sling was given to you, leave it in place for the time advised by your healthcare provider. If you aren't sure how long to wear it, ask for advice. If the sling becomes loose, adjust it so that your forearm is level with the ground. Your shoulder should feel well supported.   Put an ice pack on the injured area for 20 minutes every 1 to 2 hours the first day. You can make your own ice pack by putting ice cubes in a plastic bag. Wrap the bag in a thin towel. Continue with ice packs 3 to 4 times a day for the next 2 days. Then use the pack as needed to ease pain and swelling.   You may use acetaminophen or ibuprofen to control pain, unless another pain medicine was prescribed.If you have chronic liver or kidney disease, talk with your healthcare provider before using these medicines. Also talk with your provider if you've ever had a stomach ulcer or digestive bleeding.   Shoulder pain may  seem worse at night, when there is less to distract you from the pain. If you sleep on your side, try to keep weight off your painful shoulder. Propping pillows behind you may stop you from rolling over onto that shoulder during sleep.   Shoulder and elbow joints can become stiff if left in a sling for too long. You should start range of motion exercises about 7 to 10 days after the injury. Talk with your provider to find out what type of exercises to do and how soon to start.   You can take the sling off to shower or bathe.  Follow-up care  Follow up with your healthcare provider if you don't start to get better in the next 5 days.  When to seek medical advice  Call your healthcare provider right awayif any of these occur:   Pain or swelling gets worseor continues for more than a few days   Your hand or fingers become cold, blue, numb, or tingly   Large amount of bruising on your shoulder or upper arm   Trouble moving your hand or fingers   Weakness in your hand or fingers   Your shoulder becomes stiff   It feels like your shoulder is popping out   You are less able to do your daily activities  [employer] last reviewed this educational content on 11/18/2018   2000-2020 [employer] [address], [place] [address]. All rights reserved. This information is not intended as a substitute for professional medical care. Always follow your healthcare professional's instructions.
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
## 2019-02-13 ENCOUNTER — Telehealth (INDEPENDENT_AMBULATORY_CARE_PROVIDER_SITE_OTHER): Payer: Self-pay

## 2019-02-13 NOTE — Telephone Encounter (Signed)
Advised to call back directly if there are further questions, or if these symptoms fail to improve as anticipated or worsen.Lamaya Hyneman, RT(R)(M)

## 2020-07-26 ENCOUNTER — Encounter (HOSPITAL_COMMUNITY): Payer: Self-pay

## 2020-07-26 ENCOUNTER — Ambulatory Visit (INDEPENDENT_AMBULATORY_CARE_PROVIDER_SITE_OTHER): Payer: HRSA Program

## 2020-07-26 ENCOUNTER — Ambulatory Visit (HOSPITAL_COMMUNITY)
Admission: EM | Admit: 2020-07-26 | Discharge: 2020-07-26 | Disposition: A | Discharge status: Home / Self Care | Payer: HRSA Program | Attending: Family Medicine | Admitting: Family Medicine

## 2020-07-26 ENCOUNTER — Other Ambulatory Visit: Payer: Self-pay

## 2020-07-26 DIAGNOSIS — J40 Bronchitis, not specified as acute or chronic: Secondary | ICD-10-CM | POA: Insufficient documentation

## 2020-07-26 DIAGNOSIS — Z79899 Other long term (current) drug therapy: Secondary | ICD-10-CM | POA: Insufficient documentation

## 2020-07-26 DIAGNOSIS — Z8616 Personal history of COVID-19: Secondary | ICD-10-CM | POA: Insufficient documentation

## 2020-07-26 DIAGNOSIS — Z7952 Long term (current) use of systemic steroids: Secondary | ICD-10-CM | POA: Insufficient documentation

## 2020-07-26 DIAGNOSIS — Z20822 Contact with and (suspected) exposure to covid-19: Secondary | ICD-10-CM | POA: Diagnosis not present

## 2020-07-26 DIAGNOSIS — J45909 Unspecified asthma, uncomplicated: Secondary | ICD-10-CM | POA: Diagnosis not present

## 2020-07-26 DIAGNOSIS — R05 Cough: Secondary | ICD-10-CM

## 2020-07-26 DIAGNOSIS — R0602 Shortness of breath: Secondary | ICD-10-CM

## 2020-07-26 HISTORY — DX: Unspecified asthma, uncomplicated: J45.909

## 2020-07-26 LAB — SARS CORONAVIRUS 2 (TAT 6-24 HRS): SARS Coronavirus 2: NEGATIVE

## 2020-07-26 MED ORDER — ALBUTEROL SULFATE HFA 108 (90 BASE) MCG/ACT IN AERS: 1.0000 | INHALATION_SPRAY | Freq: Four times a day (QID) | RESPIRATORY_TRACT | 0 refills | Status: DC | PRN

## 2020-07-26 MED ORDER — PREDNISONE 10 MG PO TABS: 40.0000 mg | ORAL_TABLET | Freq: Every day | ORAL | 0 refills | Status: AC

## 2020-07-26 NOTE — Discharge Instructions (Signed)
Prednisone daily for 5 days.  Albuterol as needed Continue the mucinex Follow up as needed for continued or worsening symptoms

## 2020-07-26 NOTE — ED Triage Notes (Signed)
Pt c/o productive cough with yellow sputum since 07/08/20 and was tested positive for COVID on 08/25 at [facility]. Also reports SOB and bilateral rib pain for approx 1 week.  No fever for approx 1 week. Took Mucinex this morning.  Pt mildly SOBOE upon walking to intake room, speaks full sentences with some mild difficulty. Slightly decreased lung sounds bilaterally, faint exp wheeze right lower.

## 2020-07-26 NOTE — ED Provider Notes (Signed)
[facility]    CSN: [id number] Arrival date & time: 07/26/20  1004      History   Chief Complaint Chief Complaint  Patient presents with  . Cough  . Shortness of Breath    HPI Gloria Boyd is a 29 y.o. female.   Patient is a 29 year old female with past history of asthma.  She presents today with cough, yellow sputum since 07/08/2020 and was tested positive for Covid on 8/25 at [facility].  She has had some mild shortness of breath, bilateral rib pain chest pain for approximate 1 week.  No fever for approximately 1 week.  Mucinex this morning.  Has been taking Mucinex since this started. Is out of her inhlaer.     Past Medical History:  Diagnosis Date  . Asthma     There are no problems to display for this patient.   History reviewed. No pertinent surgical history.  OB History   No obstetric history on file.      Home Medications    Prior to Admission medications   Medication Sig Start Date End Date Taking? Authorizing Provider  Ascorbic Acid (VITAMIN C WITH ROSE HIPS) 500 MG tablet Take 500 mg by mouth daily.   Yes [provider]  albuterol (VENTOLIN HFA) 108 (90 Base) MCG/ACT inhaler Inhale 1-2 puffs into the lungs every 6 (six) hours as needed for wheezing or shortness of breath. 07/26/20   Dahlia Byes A, NP  predniSONE (DELTASONE) 10 MG tablet Take 4 tablets (40 mg total) by mouth daily for 5 days. 07/26/20 07/31/20  Janace Aris, NP    Family History Family History  Problem Relation Age of Onset  . Healthy Mother   . Healthy Father     Social History Social History   Tobacco Use  . Smoking status: Never Smoker  . Smokeless tobacco: Never Used  Vaping Use  . Vaping Use: Never used  Substance Use Topics  . Alcohol use: Yes  . Drug use: Never     Allergies   Lactose intolerance (gi)   Review of Systems Review of Systems   Physical Exam Triage Vital Signs ED Triage Vitals  Enc Vitals Group     BP 07/26/20 1143 (!)  135/101     Pulse Rate 07/26/20 1143 (!) 103     Resp 07/26/20 1143 (!) 22     Temp 07/26/20 1143 98.6 F (37 C)     Temp Source 07/26/20 1143 Oral     SpO2 07/26/20 1143 100 %     Weight --      Height --      Head Circumference --      Peak Flow --      Pain Score 07/26/20 1146 0     Pain Loc --      Pain Edu? --      Excl. in GC? --    No data found.  Updated Vital Signs BP (!) 135/101 (BP Location: Right Wrist)   Pulse (!) 103   Temp 98.6 F (37 C) (Oral)   Resp (!) 22   LMP 07/08/2020   SpO2 100%   Visual Acuity Right Eye Distance:   Left Eye Distance:   Bilateral Distance:    Right Eye Near:   Left Eye Near:    Bilateral Near:     Physical Exam Vitals and nursing note reviewed.  Constitutional:      General: She is not in acute distress.  Appearance: Normal appearance. She is well-developed. She is not ill-appearing, toxic-appearing or diaphoretic.  HENT:     Head: Normocephalic.     Nose: Nose normal.  Eyes:     Conjunctiva/sclera: Conjunctivae normal.  Pulmonary:     Effort: Pulmonary effort is normal. No tachypnea, bradypnea, accessory muscle usage or respiratory distress.     Breath sounds: Wheezing present.  Musculoskeletal:        General: Normal range of motion.     Cervical back: Normal range of motion.  Skin:    General: Skin is warm and dry.     Findings: No rash.  Neurological:     Mental Status: She is alert.  Psychiatric:        Mood and Affect: Mood normal.      UC Treatments / Results  Labs (all labs ordered are listed, but only abnormal results are displayed) Labs Reviewed  SARS CORONAVIRUS 2 (TAT 6-24 HRS)    EKG   Radiology DG Chest 2 View  Result Date: 07/26/2020 CLINICAL DATA:  Cough, shortness of breath. EXAM: CHEST - 2 VIEW COMPARISON:  None. FINDINGS: The heart size and mediastinal contours are within normal limits. Both lungs are clear. No pleural effusions. No pneumothorax. The visualized skeletal structures  are unremarkable. IMPRESSION: No active cardiopulmonary disease. Electronically Signed   By: Feliberto Harts MD   On: 07/26/2020 12:13    Procedures Procedures (including critical care time)  Medications Ordered in UC Medications - No data to display  Initial Impression / Assessment and Plan / UC Course  I have reviewed the triage vital signs and the nursing notes.  Pertinent labs & imaging results that were available during my care of the patient were reviewed by me and considered in my medical decision making (see chart for details).     Bronchitis Mild expiratory wheezing on exam.  X-ray without any acute findings Oxygen saturation 100% today and she has mild tachycardia.  No concern for PE at this time.  No specific risk factors.  Most likely post viral cough and bronchitis We will treat with prednisone daily for 5 days along with albuterol as needed.  Continue Mucinex. Follow up as needed for continued or worsening symptoms  Final Clinical Impressions(s) / UC Diagnoses   Final diagnoses:  Bronchitis     Discharge Instructions     Prednisone daily for 5 days.  Albuterol as needed Continue the mucinex Follow up as needed for continued or worsening symptoms     ED Prescriptions    Medication Sig Dispense Auth. Provider   predniSONE (DELTASONE) 10 MG tablet Take 4 tablets (40 mg total) by mouth daily for 5 days. 20 tablet Bay Wayson A, NP   albuterol (VENTOLIN HFA) 108 (90 Base) MCG/ACT inhaler Inhale 1-2 puffs into the lungs every 6 (six) hours as needed for wheezing or shortness of breath. 1 each Janace Aris, NP     PDMP not reviewed this encounter.   Janace Aris, NP 07/26/20 1239

## 2020-08-15 ENCOUNTER — Other Ambulatory Visit: Payer: Self-pay

## 2020-08-15 DIAGNOSIS — Z20822 Contact with and (suspected) exposure to covid-19: Secondary | ICD-10-CM

## 2020-08-16 LAB — SARS-COV-2, NAA 2 DAY TAT

## 2020-08-16 LAB — NOVEL CORONAVIRUS, NAA: SARS-CoV-2, NAA: NOT DETECTED

## 2020-09-14 ENCOUNTER — Other Ambulatory Visit: Payer: Self-pay

## 2020-09-14 DIAGNOSIS — Z20822 Contact with and (suspected) exposure to covid-19: Secondary | ICD-10-CM

## 2020-09-14 NOTE — Progress Notes (Signed)
nov 

## 2020-09-15 LAB — SARS-COV-2, NAA 2 DAY TAT

## 2020-09-15 LAB — NOVEL CORONAVIRUS, NAA: SARS-CoV-2, NAA: NOT DETECTED

## 2020-11-14 ENCOUNTER — Ambulatory Visit (HOSPITAL_COMMUNITY)
Admission: EM | Admit: 2020-11-14 | Discharge: 2020-11-14 | Disposition: A | Discharge status: Home / Self Care | Payer: BC Managed Care – PPO

## 2020-11-14 ENCOUNTER — Other Ambulatory Visit: Payer: Self-pay

## 2021-09-19 ENCOUNTER — Ambulatory Visit (HOSPITAL_COMMUNITY)
Admission: EM | Admit: 2021-09-19 | Discharge: 2021-09-19 | Disposition: A | Discharge status: Home / Self Care | Payer: 59 | Attending: Internal Medicine | Admitting: Internal Medicine

## 2021-09-19 ENCOUNTER — Encounter (HOSPITAL_COMMUNITY): Payer: Self-pay

## 2021-09-19 ENCOUNTER — Ambulatory Visit (INDEPENDENT_AMBULATORY_CARE_PROVIDER_SITE_OTHER): Payer: 59

## 2021-09-19 ENCOUNTER — Other Ambulatory Visit: Payer: Self-pay

## 2021-09-19 DIAGNOSIS — M778 Other enthesopathies, not elsewhere classified: Secondary | ICD-10-CM

## 2021-09-19 DIAGNOSIS — M79644 Pain in right finger(s): Secondary | ICD-10-CM

## 2021-09-19 MED ORDER — IBUPROFEN 800 MG PO TABS: 800.0000 mg | ORAL_TABLET | Freq: Three times a day (TID) | ORAL | 0 refills | Status: DC | PRN

## 2021-09-19 NOTE — ED Triage Notes (Signed)
Pt c/o rt middle finger swelling, pain, and stiffness since yesterday. Denies injury.

## 2021-09-19 NOTE — Discharge Instructions (Addendum)
Icing of the left hand Take medications as prescribed Gentle range of motion exercises X-rays negative for fracture Return to urgent care if symptoms worsen.

## 2021-09-20 NOTE — ED Provider Notes (Signed)
[facility]    CSN: [id number] Arrival date & time: 09/19/21  1158      History   Chief Complaint Chief Complaint  Patient presents with   Hand Pain    HPI Gloria Boyd is a 30 y.o. female comes to the urgent care with a 2-day history of right middle finger pain, swelling and stiffness.  Patient denies any trauma.  Symptoms started insidiously and has worsened over the course of the day.  No fever.  No known relieving factors.  Aggravated by movement of the middle finger.  No numbness or tingling.  No redness or discharge.   HPI  Past Medical History:  Diagnosis Date   Asthma     There are no problems to display for this patient.   History reviewed. No pertinent surgical history.  OB History   No obstetric history on file.      Home Medications    Prior to Admission medications   Medication Sig Start Date End Date Taking? Authorizing Provider  ibuprofen (ADVIL) 800 MG tablet Take 1 tablet (800 mg total) by mouth every 8 (eight) hours as needed. 09/19/21  Yes Alizia Greif, Britta Mccreedy, MD  albuterol (VENTOLIN HFA) 108 (90 Base) MCG/ACT inhaler Inhale 1-2 puffs into the lungs every 6 (six) hours as needed for wheezing or shortness of breath. 07/26/20   Dahlia Byes A, NP  Ascorbic Acid (VITAMIN C WITH ROSE HIPS) 500 MG tablet Take 500 mg by mouth daily.    [provider]    Family History Family History  Problem Relation Age of Onset   Healthy Mother    Healthy Father     Social History Social History   Tobacco Use   Smoking status: Never   Smokeless tobacco: Never  Vaping Use   Vaping Use: Never used  Substance Use Topics   Alcohol use: Yes   Drug use: Never     Allergies   Lactose intolerance (gi)   Review of Systems Review of Systems  Constitutional: Negative.   Gastrointestinal: Negative.   Musculoskeletal:  Positive for arthralgias and joint swelling. Negative for myalgias.  Skin: Negative.  Negative for color change and rash.   Neurological: Negative.     Physical Exam Triage Vital Signs ED Triage Vitals  Enc Vitals Group     BP 09/19/21 1416 (!) 139/93     Pulse Rate 09/19/21 1416 66     Resp 09/19/21 1416 18     Temp 09/19/21 1416 98.2 F (36.8 C)     Temp Source 09/19/21 1416 Oral     SpO2 09/19/21 1416 99 %     Weight --      Height --      Head Circumference --      Peak Flow --      Pain Score 09/19/21 1417 7     Pain Loc --      Pain Edu? --      Excl. in GC? --    No data found.  Updated Vital Signs BP (!) 139/93 (BP Location: Left Arm)   Pulse 66   Temp 98.2 F (36.8 C) (Oral)   Resp 18   LMP 09/12/2021   SpO2 99%   Visual Acuity Right Eye Distance:   Left Eye Distance:   Bilateral Distance:    Right Eye Near:   Left Eye Near:    Bilateral Near:     Physical Exam Vitals and nursing note reviewed.  Constitutional:  General: She is in acute distress.  Cardiovascular:     Rate and Rhythm: Normal rate and regular rhythm.  Pulmonary:     Effort: Pulmonary effort is normal.     Breath sounds: Normal breath sounds.  Abdominal:     General: Bowel sounds are normal.     Palpations: Abdomen is soft.  Musculoskeletal:     Comments: Tenderness over the extensor ligament of the right middle finger.  There is surrounding swelling around the metacarpophalangeal joint of the right middle finger.  Neurological:     Mental Status: She is alert.     UC Treatments / Results  Labs (all labs ordered are listed, but only abnormal results are displayed) Labs Reviewed - No data to display  EKG   Radiology DG Hand Complete Right  Result Date: 09/19/2021 CLINICAL DATA:  Right middle finger pain and swelling. Stiffness beginning yesterday. EXAM: RIGHT HAND - COMPLETE 3+ VIEW COMPARISON:  None. FINDINGS: There is no evidence of fracture or dislocation. There is no evidence of arthropathy or other focal bone abnormality. Soft tissues are unremarkable by radiography. IMPRESSION:  Negative. Electronically Signed   By: Paulina Fusi M.D.   On: 09/19/2021 15:29    Procedures Procedures (including critical care time)  Medications Ordered in UC Medications - No data to display  Initial Impression / Assessment and Plan / UC Course  I have reviewed the triage vital signs and the nursing notes.  Pertinent labs & imaging results that were available during my care of the patient were reviewed by me and considered in my medical decision making (see chart for details).     1.  Tendinitis of the extensor tendon of the right hand: Icing of the right hand Ibuprofen as needed for pain Gentle range of motion exercises X-ray of the right hand is negative for fracture Return to urgent care if symptoms worsen. Final Clinical Impressions(s) / UC Diagnoses   Final diagnoses:  Tendinitis of extensor tendon of right hand     Discharge Instructions      Icing of the left hand Take medications as prescribed Gentle range of motion exercises X-rays negative for fracture Return to urgent care if symptoms worsen.     ED Prescriptions     Medication Sig Dispense Auth. Provider   ibuprofen (ADVIL) 800 MG tablet Take 1 tablet (800 mg total) by mouth every 8 (eight) hours as needed. 30 tablet Aris Moman, Britta Mccreedy, MD      I have reviewed the PDMP during this encounter.   Merrilee Jansky, MD 09/20/21 [phone]

## 2022-11-11 IMAGING — DX DG HAND COMPLETE 3+V*R*
3 series · 3 of 3 positions shown · non-contrast
Comparison: None.

CLINICAL DATA: Right middle finger pain and swelling. Stiffness
beginning yesterday.

EXAM:
RIGHT HAND - COMPLETE 3+ VIEW

[hand pa]
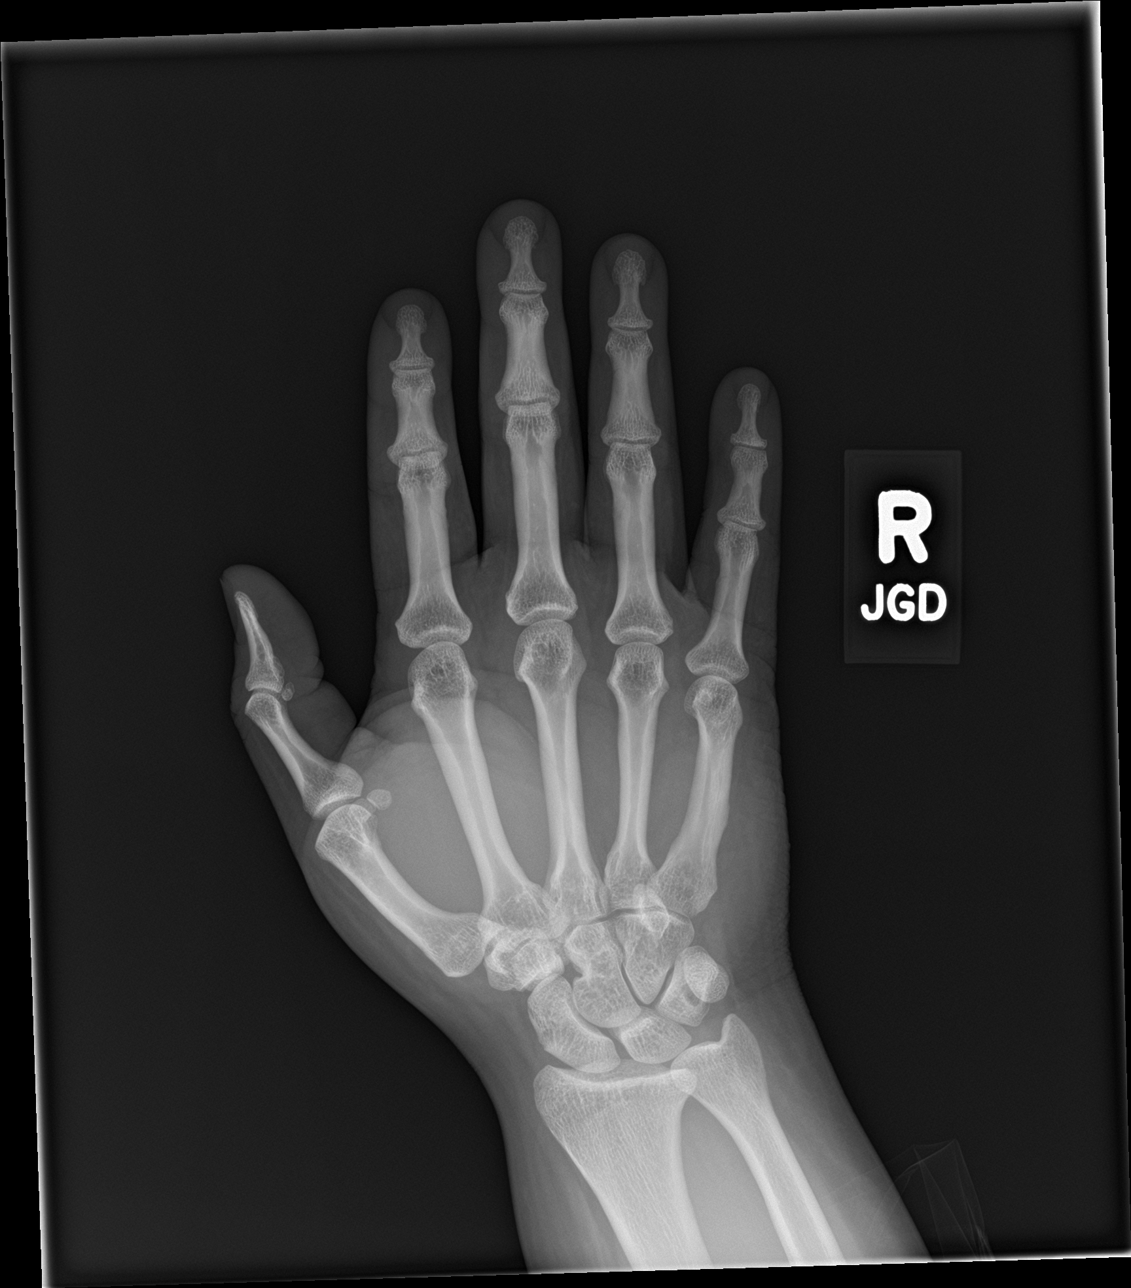

[hand obl]
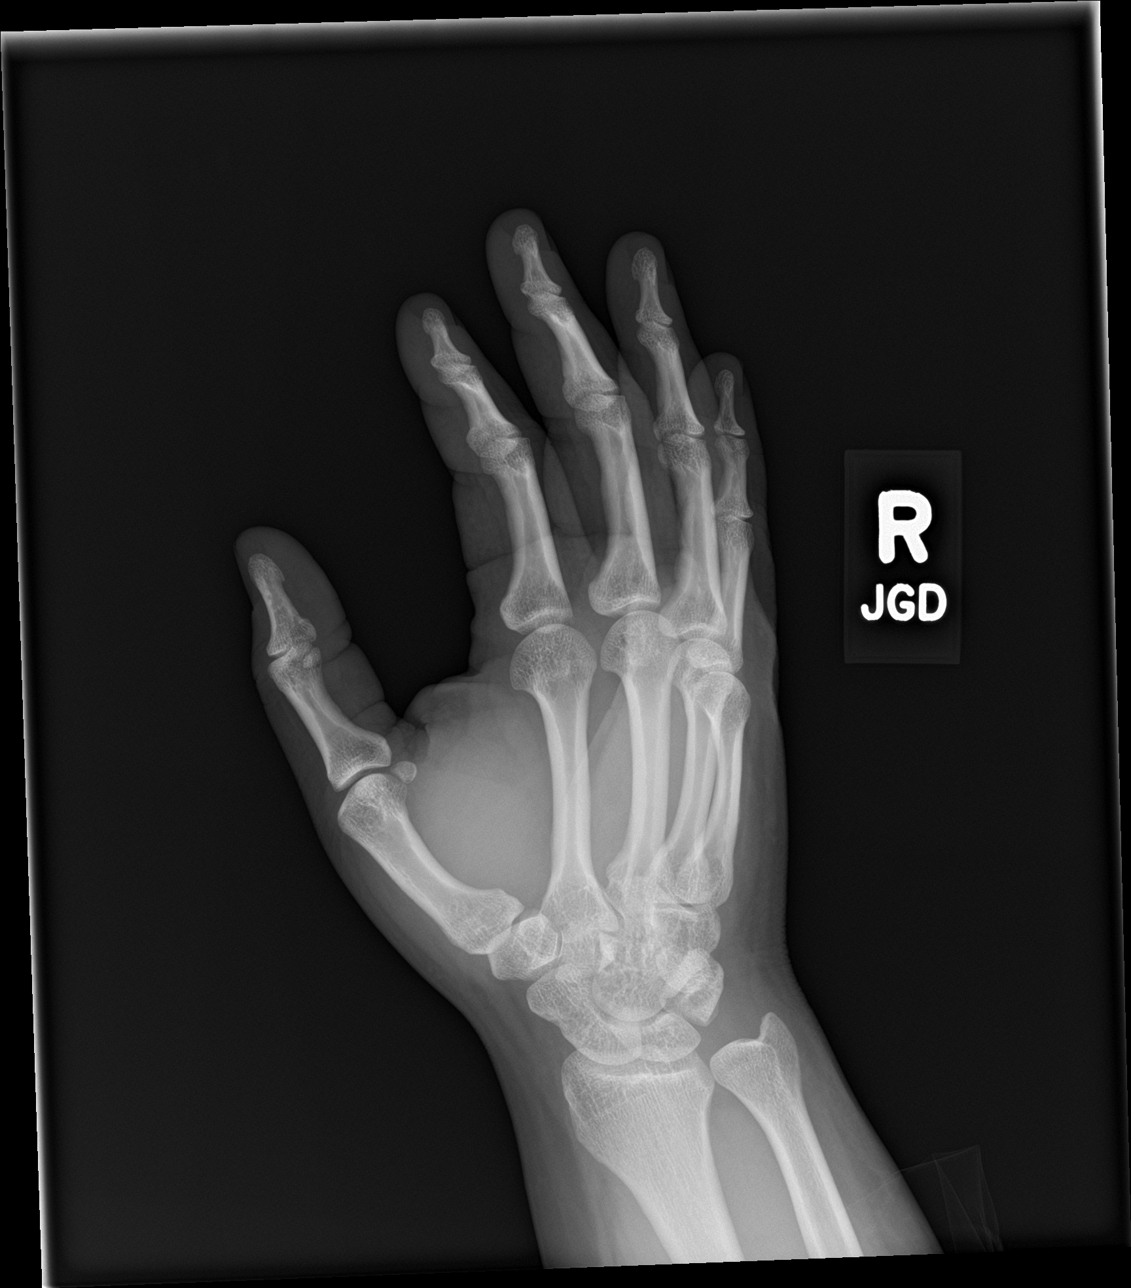

[hand lat]
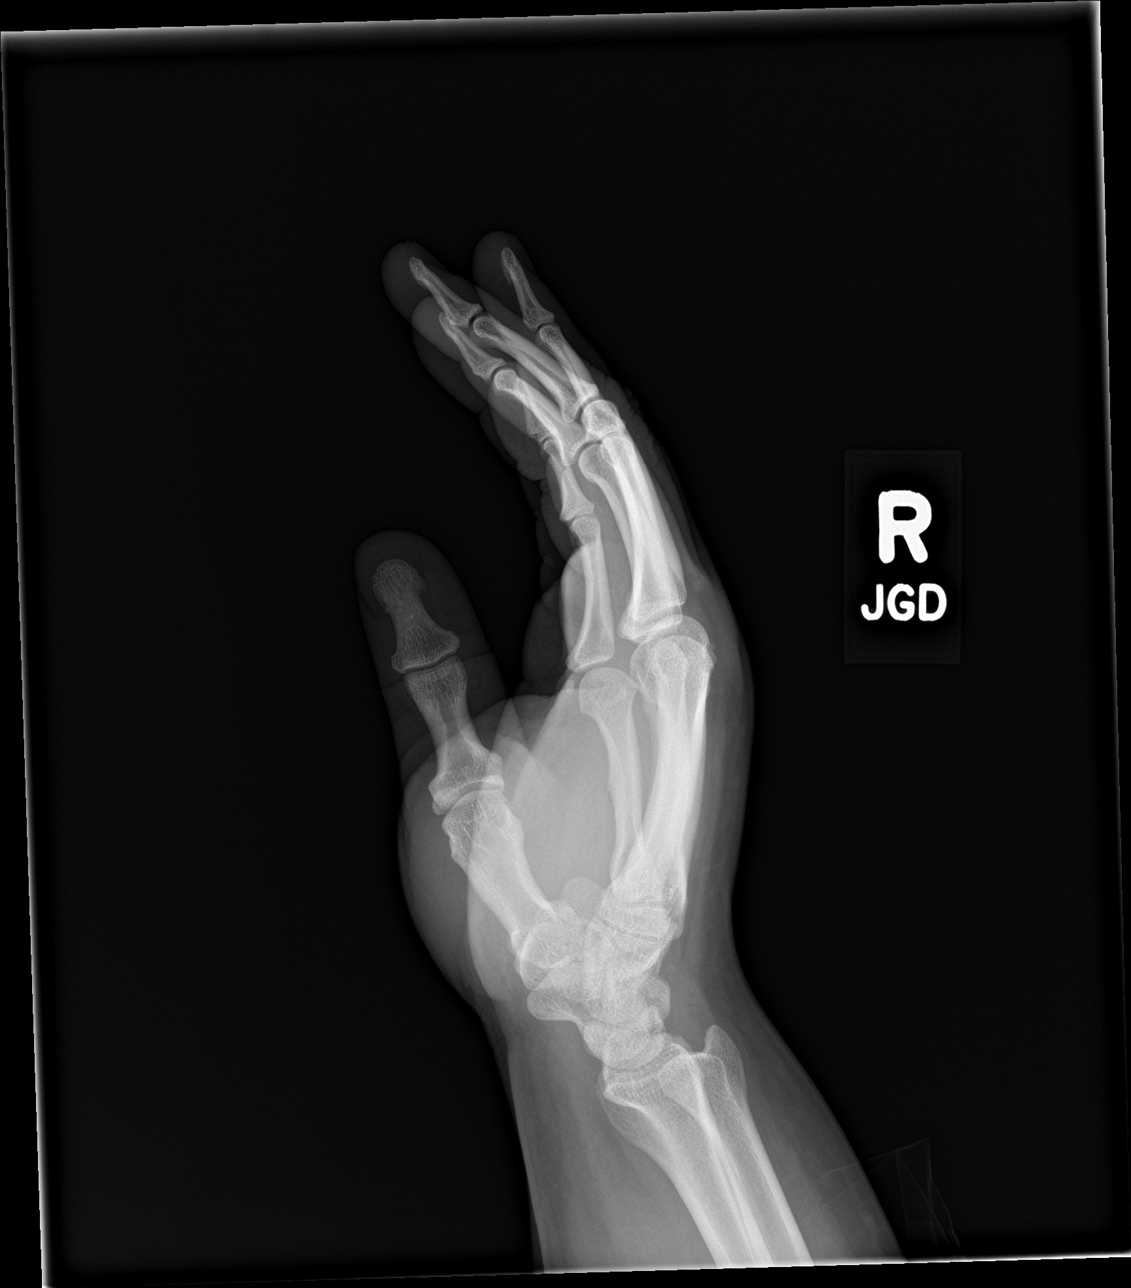

[3 of 3 positions shown; findings below may reference images not displayed]

FINDINGS: There is no evidence of fracture or dislocation. There is no
evidence of arthropathy or other focal bone abnormality. Soft
tissues are unremarkable by radiography.
IMPRESSION: Negative.

## 2023-10-07 DIAGNOSIS — E1165 Type 2 diabetes mellitus with hyperglycemia: Secondary | ICD-10-CM | POA: Insufficient documentation

## 2024-02-04 DIAGNOSIS — F411 Generalized anxiety disorder: Secondary | ICD-10-CM | POA: Diagnosis not present

## 2024-02-10 DIAGNOSIS — F411 Generalized anxiety disorder: Secondary | ICD-10-CM | POA: Diagnosis not present

## 2024-02-16 DIAGNOSIS — F411 Generalized anxiety disorder: Secondary | ICD-10-CM | POA: Diagnosis not present

## 2024-03-01 DIAGNOSIS — F411 Generalized anxiety disorder: Secondary | ICD-10-CM | POA: Diagnosis not present

## 2024-03-02 ENCOUNTER — Encounter: Payer: Self-pay | Admitting: Family Medicine

## 2024-03-03 ENCOUNTER — Ambulatory Visit (INDEPENDENT_AMBULATORY_CARE_PROVIDER_SITE_OTHER): Payer: 59 | Admitting: Family Medicine

## 2024-03-03 VITALS — BP 121/82 | HR 79 | Temp 97.6°F | Resp 16 | Ht 64.0 in | Wt 337.8 lb

## 2024-03-03 DIAGNOSIS — Z6841 Body Mass Index (BMI) 40.0 and over, adult: Secondary | ICD-10-CM

## 2024-03-03 DIAGNOSIS — E1169 Type 2 diabetes mellitus with other specified complication: Secondary | ICD-10-CM

## 2024-03-03 DIAGNOSIS — E119 Type 2 diabetes mellitus without complications: Secondary | ICD-10-CM | POA: Diagnosis not present

## 2024-03-03 DIAGNOSIS — Z7984 Long term (current) use of oral hypoglycemic drugs: Secondary | ICD-10-CM | POA: Diagnosis not present

## 2024-03-03 DIAGNOSIS — Z7985 Long-term (current) use of injectable non-insulin antidiabetic drugs: Secondary | ICD-10-CM | POA: Diagnosis not present

## 2024-03-03 DIAGNOSIS — Z794 Long term (current) use of insulin: Secondary | ICD-10-CM | POA: Diagnosis not present

## 2024-03-03 DIAGNOSIS — E66813 Obesity, class 3: Secondary | ICD-10-CM | POA: Diagnosis not present

## 2024-03-03 DIAGNOSIS — Z7689 Persons encountering health services in other specified circumstances: Secondary | ICD-10-CM | POA: Diagnosis not present

## 2024-03-03 NOTE — Progress Notes (Signed)
 Patient is here to established care with provider. ~health hx address ~care gaps address

## 2024-03-03 NOTE — Progress Notes (Unsigned)
 New Patient Office Visit  Subjective    Patient ID: Gloria Boyd, female    DOB: [birth year]  Age: 33 y.o. MRN: [id number]  CC:  Chief Complaint  Patient presents with   Establish Care    HPI Gloria Boyd presents to establish care and for follow up of chronic med issues including diabetes. Patient denies acute complaints.    Outpatient Encounter Medications as of 03/03/2024  Medication Sig   glipiZIDE (GLUCOTROL XL) 5 MG 24 hr tablet Take by mouth.   Semaglutide, 1 MG/DOSE, 4 MG/3ML SOPN Inject 1 mg into the skin once a week.   Vitamin D, Ergocalciferol, (DRISDOL) 1.25 MG (50000 UNIT) CAPS capsule Take by mouth.   albuterol  (VENTOLIN  HFA) 108 (90 Base) MCG/ACT inhaler Inhale 1-2 puffs into the lungs every 6 (six) hours as needed for wheezing or shortness of breath.   Ascorbic Acid (VITAMIN C WITH ROSE HIPS) 500 MG tablet Take 500 mg by mouth daily.   [DISCONTINUED] ibuprofen  (ADVIL ) 800 MG tablet Take 1 tablet (800 mg total) by mouth every 8 (eight) hours as needed.   No facility-administered encounter medications on file as of 03/03/2024.    Past Medical History:  Diagnosis Date   Asthma     History reviewed. No pertinent surgical history.  Family History  Problem Relation Age of Onset   Healthy Mother    Healthy Father     Social History   Socioeconomic History   Marital status: Single    Spouse name: Not on file   Number of children: Not on file   Years of education: Not on file   Highest education level: Bachelor's degree (e.g., BA, AB, BS)  Occupational History   Not on file  Tobacco Use   Smoking status: Never   Smokeless tobacco: Never  Vaping Use   Vaping status: Never Used  Substance and Sexual Activity   Alcohol use: Yes   Drug use: Never   Sexual activity: Yes    Birth control/protection: Condom, None  Other Topics Concern   Not on file  Social History Narrative   Not on file   Social Drivers of Health   Financial Resource Strain:  Medium Risk (03/02/2024)   Overall Financial Resource Strain (CARDIA)    Difficulty of Paying Living Expenses: Somewhat hard  Food Insecurity: Food Insecurity Present (03/02/2024)   Hunger Vital Sign    Worried About Running Out of Food in the Last Year: Sometimes true    Ran Out of Food in the Last Year: Sometimes true  Transportation Needs: No Transportation Needs (03/02/2024)   PRAPARE - Administrator, Civil Service (Medical): No    Lack of Transportation (Non-Medical): No  Physical Activity: Insufficiently Active (03/02/2024)   Exercise Vital Sign    Days of Exercise per Week: 4 days    Minutes of Exercise per Session: 20 min  Stress: Stress Concern Present (03/02/2024)   [employer] of Occupational Health - Occupational Stress Questionnaire    Feeling of Stress : To some extent  Social Connections: Socially Integrated (03/02/2024)   Social Connection and Isolation Panel [NHANES]    Frequency of Communication with Friends and Family: More than three times a week    Frequency of Social Gatherings with Friends and Family: Once a week    Attends Religious Services: 1 to 4 times per year    Active Member of [employer] or Organizations: Yes    Attends Banker Meetings: 1 to 4 times  per year    Marital Status: Living with partner  Intimate Partner Violence: Not At Risk (11/24/2023)   [facility]   HITS    Over the last 12 months how often did your partner physically hurt you?: Never    Over the last 12 months how often did your partner insult you or talk down to you?: Never    Over the last 12 months how often did your partner threaten you with physical harm?: Never    Over the last 12 months how often did your partner scream or curse at you?: Never    Review of Systems  All other systems reviewed and are negative.       Objective   BP 121/82   Pulse 79   Temp 97.6 F (36.4 C) (Oral)   Resp 16   Ht 5\' 4"  (1.626 m)   Wt (!) 337 lb 12.8  oz (153.2 kg)   SpO2 98%   BMI 57.98 kg/m   Physical Exam Vitals and nursing note reviewed.  Constitutional:      General: She is not in acute distress.    Appearance: She is obese.  Cardiovascular:     Rate and Rhythm: Normal rate and regular rhythm.  Pulmonary:     Effort: Pulmonary effort is normal.     Breath sounds: Normal breath sounds.  Abdominal:     Palpations: Abdomen is soft.     Tenderness: There is no abdominal tenderness.  Neurological:     General: No focal deficit present.     Mental Status: She is alert and oriented to person, place, and time.     {Labs (Optional):23779}    Assessment & Plan:   Type 2 diabetes mellitus with other specified complication, with long-term current use of insulin (HCC) -     POCT glycosylated hemoglobin (Hb A1C) -     Amb Referral to Nutrition and Diabetic Education  Diabetes mellitus treated with oral medication (HCC)  Long-term current use of injectable noninsulin antidiabetic medication  Class 3 severe obesity due to excess calories with serious comorbidity and body mass index (BMI) of 50.0 to 59.9 in adult [facility])     Return in about 3 months (around 06/02/2024).   Arlo Lama, MD

## 2024-03-04 ENCOUNTER — Ambulatory Visit: Admitting: Pharmacist

## 2024-03-08 ENCOUNTER — Encounter: Payer: Self-pay | Admitting: Family Medicine

## 2024-03-10 DIAGNOSIS — F411 Generalized anxiety disorder: Secondary | ICD-10-CM | POA: Diagnosis not present

## 2024-03-12 ENCOUNTER — Telehealth: Payer: Self-pay

## 2024-03-12 ENCOUNTER — Other Ambulatory Visit: Payer: Self-pay | Admitting: Family Medicine

## 2024-03-12 ENCOUNTER — Telehealth: Payer: Self-pay | Admitting: Pharmacist

## 2024-03-12 ENCOUNTER — Encounter: Payer: Self-pay | Admitting: Pharmacist

## 2024-03-12 ENCOUNTER — Ambulatory Visit: Attending: Family Medicine | Admitting: Pharmacist

## 2024-03-12 ENCOUNTER — Other Ambulatory Visit (HOSPITAL_COMMUNITY): Payer: Self-pay

## 2024-03-12 DIAGNOSIS — Z7984 Long term (current) use of oral hypoglycemic drugs: Secondary | ICD-10-CM | POA: Diagnosis not present

## 2024-03-12 DIAGNOSIS — Z7985 Long-term (current) use of injectable non-insulin antidiabetic drugs: Secondary | ICD-10-CM | POA: Diagnosis not present

## 2024-03-12 DIAGNOSIS — E1165 Type 2 diabetes mellitus with hyperglycemia: Secondary | ICD-10-CM | POA: Diagnosis not present

## 2024-03-12 LAB — POCT GLYCOSYLATED HEMOGLOBIN (HGB A1C): HbA1c, POC (controlled diabetic range): 6.4 % (ref 0.0–7.0)

## 2024-03-12 MED ORDER — ONETOUCH VERIO VI STRP: ORAL_STRIP | 3 refills | Status: AC

## 2024-03-12 MED ORDER — TIRZEPATIDE 5 MG/0.5ML ~~LOC~~ SOAJ: 5.0000 mg | SUBCUTANEOUS | 0 refills | Status: DC

## 2024-03-12 MED ORDER — TIRZEPATIDE 7.5 MG/0.5ML ~~LOC~~ SOAJ: 7.5000 mg | SUBCUTANEOUS | 0 refills | Status: DC

## 2024-03-12 MED ORDER — TIRZEPATIDE 2.5 MG/0.5ML ~~LOC~~ SOAJ: 2.5000 mg | SUBCUTANEOUS | 0 refills | Status: DC

## 2024-03-12 MED ORDER — ONETOUCH DELICA LANCETS 33G MISC: 3 refills | Status: AC

## 2024-03-12 NOTE — Telephone Encounter (Signed)
 Pharmacy Patient Advocate Encounter   Received notification from Pt Calls Messages that prior authorization for MOUNJARO 2.5MG  is required/requested.   Insurance verification completed.   The patient is insured through [employer] .   PA required; PA submitted to above mentioned insurance via CoverMyMeds Key/confirmation #/EOC [id number]. Status is pending

## 2024-03-12 NOTE — Progress Notes (Signed)
 S:     No chief complaint on file.  33 y.o. female who presents for diabetes evaluation, education, and management. Patient arrives in good spirits and presents without any assistance.   Patient was referred and last seen by Primary Care Provider, Dr. Elvan Hamel, on 03/03/2024. Prior to this, she received her primary care from [facility] here in [place]. Of note, her last A1c was 8.4% on 11/24/2023. I reviewed her two most recent notes from Care Everywhere. Under the care of Dr. Alfredia Ina, patient had an A1c of 10.6 in 09/2023 before her most recent A1c of 8.4 in Jan of this year. Her treatment included Ozempic and glipizide XL at that time.   With Dr. Elvan Hamel last week, pt reports A1c was 6.5% but I cannot find this in our system. Patient reports Diabetes was diagnosed originally as preDM ~2023. Then was type 2 DM in November 2024 S/p steroid use for a chronic cough.  No known hx of clinical ASCVD, CHF, or CKD. No pancreatitis hx, no thyroid cancer. Has been on Ozempic 1 mg weekly now since February of this year. Compliant with this and her glipizide 5 mg XL daily. Denies any current NV, abdominal pain.  Family/Social History:  Fhx: no pertinent positives Tobacco: never Alcohol: none   Current diabetes medications include: glipizide 5 mg XL daily, Ozempic 1 mg weekly Current hypertension medications include: none Current hyperlipidemia medications include: none  Patient reports adherence to taking all medications as prescribed.   Insurance coverage: [employer]  Patient denies hypoglycemic events.  Reported home fasting blood sugars: requests refills on GM supplies.   Reported 2 hour post-meal/random blood sugars: requests refills on GM supplies. Insurance denies [place] as it requires   Patient denies nocturia (nighttime urination).  Patient denies neuropathy (nerve pain). Patient denies visual changes. Patient reports self foot exams.   Patient  reported dietary habits:  -Trying to eat a heavier lunch/lighter dinner -Very knowledgeable concerning diet. However, she feels like she would benefit from Nutritionist referral   Patient-reported exercise habits: not covered at this visit.    O:   Lab Results  Component Value Date   HGBA1C 6.4 03/12/2024   There were no vitals filed for this visit.  Lipid Panel  No results found for: "CHOL", "TRIG", "HDL", "CHOLHDL", "VLDL", "LDLCALC", "LDLDIRECT"  Clinical Atherosclerotic Cardiovascular Disease (ASCVD): No  The ASCVD Risk score (Arnett DK, et al., 2019) failed to calculate for the following reasons:   The 2019 ASCVD risk score is only valid for ages 45 to 71   Patient is participating in a Managed Medicaid Plan: No   A/P: Diabetes dx'd last year currently well controlled. Patient is not symptomatic from a hyper- or hypoglycemia standpoint at this time. She is able to verbalize appropriate hypoglycemia management plan. Medication adherence appears to be optimal. We discussed improved weight loss with Mounjaro and will pursue PA approval for this.  -Continued Ozempic for now.   -Rxns sent for Mounjaro 2.5, 5, and 7.5 mg starting doses. Message sent to pharmacy for PA approval. I will reach out once we hear about this.   -Stop glipizide.  -Patient educated on purpose, proper use, and potential adverse effects of Mounjaro vs. Ozempic.  -Extensively discussed pathophysiology of diabetes, recommended lifestyle interventions, dietary effects on blood sugar control.  -Counseled on s/sx of and management of hypoglycemia.  -Next A1c anticipated 05/2024.  -uACR, lipid, and CMP14+eGFR -Amb Referral to Medical Nutrition Therapy.  Written patient instructions provided. Patient verbalized understanding of treatment plan.  Total time in face to face counseling 30 minutes.    Follow-up:  Pharmacist in 6 weeks PCP clinic visit in 6 months  Gloria Boyd, PharmD, [place], CPP Clinical  Pharmacist [facility] & [facility] [phone]

## 2024-03-12 NOTE — Telephone Encounter (Signed)
 Looping in [place] since [place] is out today (03/12/2024).   Hey friends,   This is non-emergent, so if you need to handle other things Loetta Ringer can handle this once she can get to it upon return from [facility]. Patient is on Ozempic with good diabetes control. However, she has been taking a higher dose now since 12/2023 with suboptimal weight loss. Can we submit a PA for Mounjaro?

## 2024-03-14 LAB — LIPID PANEL
Chol/HDL Ratio: 3.6 ratio (ref 0.0–4.4)
Cholesterol, Total: 189 mg/dL (ref 100–199)
HDL: 52 mg/dL (ref >39)
LDL Chol Calc (NIH): 116 mg/dL — ABNORMAL HIGH (ref 0–99)
Triglycerides: 116 mg/dL (ref 0–149)
VLDL Cholesterol Cal: 21 mg/dL (ref 5–40)

## 2024-03-14 LAB — CMP14+EGFR
ALT: 16 IU/L (ref 0–32)
AST: 23 IU/L (ref 0–40)
Albumin: 4.2 g/dL (ref 3.9–4.9)
Alkaline Phosphatase: 91 IU/L (ref 44–121)
BUN/Creatinine Ratio: 11 (ref 9–23)
BUN: 10 mg/dL (ref 6–20)
Bilirubin Total: 0.4 mg/dL (ref 0.0–1.2)
CO2: 23 mmol/L (ref 20–29)
Calcium: 9.7 mg/dL (ref 8.7–10.2)
Chloride: 100 mmol/L (ref 96–106)
Creatinine, Ser: 0.95 mg/dL (ref 0.57–1.00)
Globulin, Total: 2.7 g/dL (ref 1.5–4.5)
Glucose: 110 mg/dL — ABNORMAL HIGH (ref 70–99)
Potassium: 4.6 mmol/L (ref 3.5–5.2)
Sodium: 139 mmol/L (ref 134–144)
Total Protein: 6.9 g/dL (ref 6.0–8.5)
eGFR: 81 mL/min/{1.73_m2} (ref >59)

## 2024-03-14 LAB — MICROALBUMIN / CREATININE URINE RATIO
Creatinine, Urine: 89.3 mg/dL
Microalb/Creat Ratio: 19 mg/g{creat} (ref 0–29)
Microalbumin, Urine: 16.9 ug/mL

## 2024-03-15 ENCOUNTER — Other Ambulatory Visit: Payer: Self-pay

## 2024-03-15 ENCOUNTER — Other Ambulatory Visit: Payer: Self-pay | Admitting: Family Medicine

## 2024-03-15 ENCOUNTER — Other Ambulatory Visit: Payer: Self-pay | Admitting: Pharmacist

## 2024-03-15 MED ORDER — SEMAGLUTIDE (2 MG/DOSE) 8 MG/3ML ~~LOC~~ SOPN: 2.0000 mg | PEN_INJECTOR | SUBCUTANEOUS | 2 refills | Status: AC

## 2024-03-15 MED ORDER — TRULICITY 0.75 MG/0.5ML ~~LOC~~ SOAJ
0.7500 mg | SUBCUTANEOUS | 1 refills | Status: AC
Start: 2024-03-15 — End: ?

## 2024-03-15 NOTE — Telephone Encounter (Signed)
 Sounds good, friend. Thank you so much for documenting this. She has been getting Ozempic covered and I'll try her on the 2mg  dose before changing to another product. Now I know what she'll need Mounjaro approval in the future.

## 2024-03-15 NOTE — Telephone Encounter (Signed)
 Pharmacy Patient Advocate Encounter  Received notification from [employer] that Prior Authorization for MOUNJARO has been DENIED.  Full denial letter will be uploaded to the media tab. See denial reason below.  Your plan only covers this drug when you meet one of these options: A) You have tried other products your plan covers (preferred products), and they did not work well for you, or B) Your doctor gives us  a medical reason you cannot take those other products. For your plan, you may need to try up to three preferred products. We have denied your request because you do not meet any of these conditions. We reviewed the information we had. Your request has been denied. Your doctor can send us  any new or missing information for us  to review. The preferred products for your plan are: Rybelsus, Trulicity, Liraglutide.

## 2024-03-17 ENCOUNTER — Encounter: Payer: Self-pay | Admitting: Family Medicine

## 2024-03-23 DIAGNOSIS — F411 Generalized anxiety disorder: Secondary | ICD-10-CM | POA: Diagnosis not present

## 2024-03-26 ENCOUNTER — Ambulatory Visit: Admitting: Pharmacist

## 2024-03-29 DIAGNOSIS — F411 Generalized anxiety disorder: Secondary | ICD-10-CM | POA: Diagnosis not present

## 2024-04-05 DIAGNOSIS — F411 Generalized anxiety disorder: Secondary | ICD-10-CM | POA: Diagnosis not present

## 2024-04-13 DIAGNOSIS — F411 Generalized anxiety disorder: Secondary | ICD-10-CM | POA: Diagnosis not present

## 2024-04-14 NOTE — Progress Notes (Signed)
 Diabetes Self-Management Education  Visit Type: First/Initial  Appt. Start Time: 0846 Appt. End Time: 1914  04/21/2024  [name], identified by name and date of birth, is a 33 y.o. female with a diagnosis of Diabetes: Type 2.   ASSESSMENT  Patient is here today alone. Patient would like to learn how to promote reductions to a lower BMI range. Patient lives with her significant other.  Pt reports shared shopping and cooking.Pt is lactose intolerant and states she will take lactase enzyme or select lactose free products. Pt reports she can tolerate cheese in small amounts and selects almond milk. 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 Pt reports she has made the following changes including drinking less sweet tea, portioning her meals and increasing water intake. Pt reports she last checked her blood sugar at fasting "last week" with a value obtained of "105" mg/dL.  Pt reports physical activity of walking the dog and using you tube videos. Pt reports poor sleep and states 6-7 hours on average nightly. All Pt's questions were answered during this encounter.    History includes:   Past Medical History:  Diagnosis Date   Asthma     Medications include:   Current Outpatient Medications:    Ascorbic Acid (VITAMIN C WITH ROSE HIPS) 500 MG tablet, Take 500 mg by mouth daily., Disp: , Rfl:    Blood Glucose Monitoring Suppl (ONETOUCH VERIO FLEX SYSTEM) w/Device KIT, by Does not apply route., Disp: , Rfl:    glucose blood (ONETOUCH VERIO) test strip, Use to check blood sugar three times, Disp: 300 each, Rfl: 3   OneTouch Delica Lancets 33G MISC, Use to check blood sugar three times, Disp: 300 each, Rfl: 3   Vitamin D, Ergocalciferol, (DRISDOL) 1.25 MG (50000 UNIT) CAPS capsule, Take by mouth., Disp: , Rfl:    albuterol  (VENTOLIN  HFA) 108 (90 Base) MCG/ACT inhaler, Inhale into the lungs., Disp: , Rfl:    Continuous Glucose Receiver (FREESTYLE LIBRE 3 READER) DEVI, by Does not apply  route. (Patient not taking: Reported on 04/21/2024), Disp: , Rfl:    Continuous Glucose Sensor (FREESTYLE LIBRE 3 SENSOR) MISC, Place onto the skin. (Patient not taking: Reported on 04/21/2024), Disp: , Rfl:    Dulaglutide (TRULICITY) 0.75 MG/0.5ML SOAJ, Inject 0.75 mg into the skin once a week. (Patient not taking: Reported on 04/21/2024), Disp: 2 mL, Rfl: 1   Semaglutide , 2 MG/DOSE, 8 MG/3ML SOPN, Inject 2 mg as directed once a week. (Patient not taking: Reported on 04/21/2024), Disp: 3 mL, Rfl: 2  Labs noted:   Lab Results  Component Value Date   HGBA1C 6.4 03/12/2024   No results found for: "25OHVITD2", "25OHVITD3", "VD25OH"  There were no vitals taken for this visit. There is no height or weight on file to calculate BMI. Wt Readings from Last 3 Encounters:  03/03/24 (!) 337 lb 12.8 oz (153.2 kg)    Diabetes Self-Management Education - 04/21/24 0852       Visit Information   Visit Type First/Initial      Initial Visit   Diabetes Type Type 2    Date Diagnosed 2024    Are you currently following a meal plan? Yes    What type of meal plan do you follow? "low carb"    Are you taking your medications as prescribed? Not on Medications      Health Coping   How would you rate your overall health? Fair      Psychosocial Assessment  Patient Belief/Attitude about Diabetes Defeat/Burnout    What is the hardest part about your diabetes right now, causing you the most concern, or is the most worrisome to you about your diabetes?   Making healty food and beverage choices    Self-care barriers None    Self-management support Doctor's office    Other persons present Patient    Patient Concerns Nutrition/Meal planning    Special Needs None    Preferred Learning Style No preference indicated    Learning Readiness Change in progress    How often do you need to have someone help you when you read instructions, pamphlets, or other written materials from your doctor or pharmacy? 1 - Never    What is  the last grade level you completed in school? college      Pre-Education Assessment   Patient understands the diabetes disease and treatment process. Needs Instruction    Patient understands incorporating nutritional management into lifestyle. Needs Instruction    Patient undertands incorporating physical activity into lifestyle. Needs Instruction    Patient understands using medications safely. Needs Instruction    Patient understands monitoring blood glucose, interpreting and using results Comprehends key points    Patient understands prevention, detection, and treatment of acute complications. Needs Instruction    Patient understands prevention, detection, and treatment of chronic complications. Needs Instruction    Patient understands how to develop strategies to address psychosocial issues. Needs Instruction    Patient understands how to develop strategies to promote health/change behavior. Needs Instruction      Complications   Last HgB A1C per patient/outside source 6.4 %    How often do you check your blood sugar? > 4 times/day    Fasting Blood glucose range (mg/dL) 16-109    Postprandial Blood glucose range (mg/dL) 60-454    Number of hypoglycemic episodes per month 0    Number of hyperglycemic episodes ( >200mg /dL): Rare    Can you tell when your blood sugar is high? Yes    What do you do if your blood sugar is high? vision changes    Have you had a dilated eye exam in the past 12 months? Yes    Have you had a dental exam in the past 12 months? No    Are you checking your feet? Yes    How many days per week are you checking your feet? 6   Pt instructed to cheeck feet daily     Dietary Intake   Breakfast skips 2 /d/w or eggs, multi grain toast, sparkling water    Lunch velvetta macaroni and cheese  ~ 1 cup, sparling water    Dinner ~ 8pm: ground beef, sweet potateos, bell peppers, zucchini, sparkling water    Snack (evening) grapes, gelato    Beverage(s) water, sparkling  water, sweet tea twice monthly      Activity / Exercise   Activity / Exercise Type Light (walking / raking leaves)    How many days per week do you exercise? 2    How many minutes per day do you exercise? 30    Total minutes per week of exercise 60      Patient Education   Previous Diabetes Education No    Disease Pathophysiology Definition of diabetes, type 1 and 2, and the diagnosis of diabetes    Healthy Eating Plate Method;Role of diet in the treatment of diabetes and the relationship between the three main macronutrients and blood glucose level    Being  Active Role of exercise on diabetes management, blood pressure control and cardiac health.    Medications Other (comment)    Monitoring Identified appropriate SMBG and/or A1C goals.    Acute complications Discussed and identified patients' prevention, symptoms, and treatment of hyperglycemia.    Chronic complications Relationship between chronic complications and blood glucose control    Diabetes Stress and Support Identified and addressed patients feelings and concerns about diabetes    Preconception care Role of family planning for patients with diabetes    Lifestyle and Health Coping Lifestyle issues that need to be addressed for better diabetes care      Individualized Goals (developed by patient)   Nutrition Follow meal plan discussed    Physical Activity Exercise 1-2 times per week;15 minutes per day    Medications Other (comment)    Monitoring  Test my blood glucose as discussed    Problem Solving Eating Pattern;Addressing barriers to behavior change    Reducing Risk examine blood glucose patterns;do foot checks daily    Health Coping Ask for help with psychological, social, or emotional issues      Post-Education Assessment   Patient understands the diabetes disease and treatment process. Comprehends key points    Patient understands incorporating nutritional management into lifestyle. Comprehends key points    Patient  undertands incorporating physical activity into lifestyle. Comprehends key points    Patient understands using medications safely. Needs Instruction    Patient understands monitoring blood glucose, interpreting and using results Comprehends key points    Patient understands prevention, detection, and treatment of acute complications. Comprehends key points    Patient understands prevention, detection, and treatment of chronic complications. Comprehends key points    Patient understands how to develop strategies to address psychosocial issues. Needs Review    Patient understands how to develop strategies to promote health/change behavior. Needs Review      Outcomes   Expected Outcomes Demonstrated interest in learning. Expect positive outcomes    Future DMSE 3-4 months    Program Status Not Completed             Individualized Plan for Diabetes Self-Management Training:   Learning Objective:  Patient will have a greater understanding of diabetes self-management. Patient education plan is to attend individual and/or group sessions per assessed needs and concerns.   Plan:   Patient Instructions  Increase physical activity aiming for 150-300 minutes weekly as tolerated with MD consent  Aim for balanced meals using the plate planner   Expected Outcomes:  Demonstrated interest in learning. Expect positive outcomes  Education material provided: ADA - How to Thrive: A Guide for Your Journey with Diabetes, My Plate, Snack sheet, Support group flyer, and Diabetes Resources  If problems or questions, patient to contact team via:  Phone  Future DSME appointment: 3-4 months

## 2024-04-20 DIAGNOSIS — F411 Generalized anxiety disorder: Secondary | ICD-10-CM | POA: Diagnosis not present

## 2024-04-21 ENCOUNTER — Encounter: Attending: Family Medicine | Admitting: Dietician

## 2024-04-21 DIAGNOSIS — E1165 Type 2 diabetes mellitus with hyperglycemia: Secondary | ICD-10-CM | POA: Diagnosis not present

## 2024-04-21 NOTE — Patient Instructions (Signed)
 Increase physical activity aiming for 150-300 minutes weekly as tolerated with MD consent  Aim for balanced meals using the plate planner

## 2024-04-23 ENCOUNTER — Ambulatory Visit: Admitting: Pharmacist

## 2024-05-13 DIAGNOSIS — F411 Generalized anxiety disorder: Secondary | ICD-10-CM | POA: Diagnosis not present

## 2024-05-20 DIAGNOSIS — F411 Generalized anxiety disorder: Secondary | ICD-10-CM | POA: Diagnosis not present

## 2024-05-25 DIAGNOSIS — M25551 Pain in right hip: Secondary | ICD-10-CM | POA: Diagnosis not present

## 2024-05-25 DIAGNOSIS — M9903 Segmental and somatic dysfunction of lumbar region: Secondary | ICD-10-CM | POA: Diagnosis not present

## 2024-05-25 DIAGNOSIS — M9901 Segmental and somatic dysfunction of cervical region: Secondary | ICD-10-CM | POA: Diagnosis not present

## 2024-05-25 DIAGNOSIS — M4723 Other spondylosis with radiculopathy, cervicothoracic region: Secondary | ICD-10-CM | POA: Diagnosis not present

## 2024-05-25 DIAGNOSIS — M9902 Segmental and somatic dysfunction of thoracic region: Secondary | ICD-10-CM | POA: Diagnosis not present

## 2024-05-25 DIAGNOSIS — M9905 Segmental and somatic dysfunction of pelvic region: Secondary | ICD-10-CM | POA: Diagnosis not present

## 2024-05-25 DIAGNOSIS — M47816 Spondylosis without myelopathy or radiculopathy, lumbar region: Secondary | ICD-10-CM | POA: Diagnosis not present

## 2024-05-26 DIAGNOSIS — M25551 Pain in right hip: Secondary | ICD-10-CM | POA: Diagnosis not present

## 2024-05-26 DIAGNOSIS — M9903 Segmental and somatic dysfunction of lumbar region: Secondary | ICD-10-CM | POA: Diagnosis not present

## 2024-05-26 DIAGNOSIS — M9902 Segmental and somatic dysfunction of thoracic region: Secondary | ICD-10-CM | POA: Diagnosis not present

## 2024-05-26 DIAGNOSIS — M9901 Segmental and somatic dysfunction of cervical region: Secondary | ICD-10-CM | POA: Diagnosis not present

## 2024-05-26 DIAGNOSIS — M9905 Segmental and somatic dysfunction of pelvic region: Secondary | ICD-10-CM | POA: Diagnosis not present

## 2024-05-26 DIAGNOSIS — M47816 Spondylosis without myelopathy or radiculopathy, lumbar region: Secondary | ICD-10-CM | POA: Diagnosis not present

## 2024-05-26 DIAGNOSIS — M4723 Other spondylosis with radiculopathy, cervicothoracic region: Secondary | ICD-10-CM | POA: Diagnosis not present

## 2024-05-27 DIAGNOSIS — F411 Generalized anxiety disorder: Secondary | ICD-10-CM | POA: Diagnosis not present

## 2024-05-31 DIAGNOSIS — M25551 Pain in right hip: Secondary | ICD-10-CM | POA: Diagnosis not present

## 2024-05-31 DIAGNOSIS — M9902 Segmental and somatic dysfunction of thoracic region: Secondary | ICD-10-CM | POA: Diagnosis not present

## 2024-05-31 DIAGNOSIS — M9901 Segmental and somatic dysfunction of cervical region: Secondary | ICD-10-CM | POA: Diagnosis not present

## 2024-05-31 DIAGNOSIS — M47816 Spondylosis without myelopathy or radiculopathy, lumbar region: Secondary | ICD-10-CM | POA: Diagnosis not present

## 2024-05-31 DIAGNOSIS — M4723 Other spondylosis with radiculopathy, cervicothoracic region: Secondary | ICD-10-CM | POA: Diagnosis not present

## 2024-05-31 DIAGNOSIS — M9903 Segmental and somatic dysfunction of lumbar region: Secondary | ICD-10-CM | POA: Diagnosis not present

## 2024-05-31 DIAGNOSIS — M9905 Segmental and somatic dysfunction of pelvic region: Secondary | ICD-10-CM | POA: Diagnosis not present

## 2024-06-02 DIAGNOSIS — M9903 Segmental and somatic dysfunction of lumbar region: Secondary | ICD-10-CM | POA: Diagnosis not present

## 2024-06-02 DIAGNOSIS — M47816 Spondylosis without myelopathy or radiculopathy, lumbar region: Secondary | ICD-10-CM | POA: Diagnosis not present

## 2024-06-02 DIAGNOSIS — M4723 Other spondylosis with radiculopathy, cervicothoracic region: Secondary | ICD-10-CM | POA: Diagnosis not present

## 2024-06-02 DIAGNOSIS — M9901 Segmental and somatic dysfunction of cervical region: Secondary | ICD-10-CM | POA: Diagnosis not present

## 2024-06-02 DIAGNOSIS — M9902 Segmental and somatic dysfunction of thoracic region: Secondary | ICD-10-CM | POA: Diagnosis not present

## 2024-06-02 DIAGNOSIS — M25551 Pain in right hip: Secondary | ICD-10-CM | POA: Diagnosis not present

## 2024-06-02 DIAGNOSIS — M9905 Segmental and somatic dysfunction of pelvic region: Secondary | ICD-10-CM | POA: Diagnosis not present

## 2024-06-03 DIAGNOSIS — M4723 Other spondylosis with radiculopathy, cervicothoracic region: Secondary | ICD-10-CM | POA: Diagnosis not present

## 2024-06-03 DIAGNOSIS — M9903 Segmental and somatic dysfunction of lumbar region: Secondary | ICD-10-CM | POA: Diagnosis not present

## 2024-06-03 DIAGNOSIS — M9901 Segmental and somatic dysfunction of cervical region: Secondary | ICD-10-CM | POA: Diagnosis not present

## 2024-06-03 DIAGNOSIS — M9902 Segmental and somatic dysfunction of thoracic region: Secondary | ICD-10-CM | POA: Diagnosis not present

## 2024-06-03 DIAGNOSIS — M25551 Pain in right hip: Secondary | ICD-10-CM | POA: Diagnosis not present

## 2024-06-03 DIAGNOSIS — M9905 Segmental and somatic dysfunction of pelvic region: Secondary | ICD-10-CM | POA: Diagnosis not present

## 2024-06-03 DIAGNOSIS — M47816 Spondylosis without myelopathy or radiculopathy, lumbar region: Secondary | ICD-10-CM | POA: Diagnosis not present

## 2024-06-08 DIAGNOSIS — F411 Generalized anxiety disorder: Secondary | ICD-10-CM | POA: Diagnosis not present

## 2024-06-09 DIAGNOSIS — M47816 Spondylosis without myelopathy or radiculopathy, lumbar region: Secondary | ICD-10-CM | POA: Diagnosis not present

## 2024-06-09 DIAGNOSIS — M9905 Segmental and somatic dysfunction of pelvic region: Secondary | ICD-10-CM | POA: Diagnosis not present

## 2024-06-09 DIAGNOSIS — M4723 Other spondylosis with radiculopathy, cervicothoracic region: Secondary | ICD-10-CM | POA: Diagnosis not present

## 2024-06-09 DIAGNOSIS — M25551 Pain in right hip: Secondary | ICD-10-CM | POA: Diagnosis not present

## 2024-06-09 DIAGNOSIS — M9903 Segmental and somatic dysfunction of lumbar region: Secondary | ICD-10-CM | POA: Diagnosis not present

## 2024-06-09 DIAGNOSIS — M9902 Segmental and somatic dysfunction of thoracic region: Secondary | ICD-10-CM | POA: Diagnosis not present

## 2024-06-09 DIAGNOSIS — M9901 Segmental and somatic dysfunction of cervical region: Secondary | ICD-10-CM | POA: Diagnosis not present

## 2024-06-10 DIAGNOSIS — M9905 Segmental and somatic dysfunction of pelvic region: Secondary | ICD-10-CM | POA: Diagnosis not present

## 2024-06-10 DIAGNOSIS — M4723 Other spondylosis with radiculopathy, cervicothoracic region: Secondary | ICD-10-CM | POA: Diagnosis not present

## 2024-06-10 DIAGNOSIS — M9903 Segmental and somatic dysfunction of lumbar region: Secondary | ICD-10-CM | POA: Diagnosis not present

## 2024-06-10 DIAGNOSIS — M47816 Spondylosis without myelopathy or radiculopathy, lumbar region: Secondary | ICD-10-CM | POA: Diagnosis not present

## 2024-06-10 DIAGNOSIS — M9902 Segmental and somatic dysfunction of thoracic region: Secondary | ICD-10-CM | POA: Diagnosis not present

## 2024-06-10 DIAGNOSIS — M25551 Pain in right hip: Secondary | ICD-10-CM | POA: Diagnosis not present

## 2024-06-10 DIAGNOSIS — M9901 Segmental and somatic dysfunction of cervical region: Secondary | ICD-10-CM | POA: Diagnosis not present

## 2024-06-11 NOTE — Progress Notes (Unsigned)
  Start: *** end: *** Patient is here today *** Patient would like to learn *** Patient lives with ***.  *** shopping and cooking.  History includes:  *** Medications include:  *** Labs noted:  ***   6.4%, ? GLP

## 2024-06-14 DIAGNOSIS — M9903 Segmental and somatic dysfunction of lumbar region: Secondary | ICD-10-CM | POA: Diagnosis not present

## 2024-06-14 DIAGNOSIS — M4723 Other spondylosis with radiculopathy, cervicothoracic region: Secondary | ICD-10-CM | POA: Diagnosis not present

## 2024-06-14 DIAGNOSIS — M47816 Spondylosis without myelopathy or radiculopathy, lumbar region: Secondary | ICD-10-CM | POA: Diagnosis not present

## 2024-06-14 DIAGNOSIS — M25551 Pain in right hip: Secondary | ICD-10-CM | POA: Diagnosis not present

## 2024-06-14 DIAGNOSIS — M9901 Segmental and somatic dysfunction of cervical region: Secondary | ICD-10-CM | POA: Diagnosis not present

## 2024-06-14 DIAGNOSIS — M9905 Segmental and somatic dysfunction of pelvic region: Secondary | ICD-10-CM | POA: Diagnosis not present

## 2024-06-14 DIAGNOSIS — M9902 Segmental and somatic dysfunction of thoracic region: Secondary | ICD-10-CM | POA: Diagnosis not present

## 2024-06-15 DIAGNOSIS — F411 Generalized anxiety disorder: Secondary | ICD-10-CM | POA: Diagnosis not present

## 2024-06-16 DIAGNOSIS — M4723 Other spondylosis with radiculopathy, cervicothoracic region: Secondary | ICD-10-CM | POA: Diagnosis not present

## 2024-06-16 DIAGNOSIS — M47816 Spondylosis without myelopathy or radiculopathy, lumbar region: Secondary | ICD-10-CM | POA: Diagnosis not present

## 2024-06-16 DIAGNOSIS — M9901 Segmental and somatic dysfunction of cervical region: Secondary | ICD-10-CM | POA: Diagnosis not present

## 2024-06-16 DIAGNOSIS — M9902 Segmental and somatic dysfunction of thoracic region: Secondary | ICD-10-CM | POA: Diagnosis not present

## 2024-06-16 DIAGNOSIS — M9905 Segmental and somatic dysfunction of pelvic region: Secondary | ICD-10-CM | POA: Diagnosis not present

## 2024-06-16 DIAGNOSIS — M25551 Pain in right hip: Secondary | ICD-10-CM | POA: Diagnosis not present

## 2024-06-16 DIAGNOSIS — M9903 Segmental and somatic dysfunction of lumbar region: Secondary | ICD-10-CM | POA: Diagnosis not present

## 2024-06-17 DIAGNOSIS — M9905 Segmental and somatic dysfunction of pelvic region: Secondary | ICD-10-CM | POA: Diagnosis not present

## 2024-06-17 DIAGNOSIS — M9902 Segmental and somatic dysfunction of thoracic region: Secondary | ICD-10-CM | POA: Diagnosis not present

## 2024-06-17 DIAGNOSIS — M4723 Other spondylosis with radiculopathy, cervicothoracic region: Secondary | ICD-10-CM | POA: Diagnosis not present

## 2024-06-17 DIAGNOSIS — M25551 Pain in right hip: Secondary | ICD-10-CM | POA: Diagnosis not present

## 2024-06-17 DIAGNOSIS — M9903 Segmental and somatic dysfunction of lumbar region: Secondary | ICD-10-CM | POA: Diagnosis not present

## 2024-06-17 DIAGNOSIS — M47816 Spondylosis without myelopathy or radiculopathy, lumbar region: Secondary | ICD-10-CM | POA: Diagnosis not present

## 2024-06-17 DIAGNOSIS — M9901 Segmental and somatic dysfunction of cervical region: Secondary | ICD-10-CM | POA: Diagnosis not present

## 2024-06-18 ENCOUNTER — Encounter: Attending: Family Medicine | Admitting: Dietician

## 2024-06-18 DIAGNOSIS — E1169 Type 2 diabetes mellitus with other specified complication: Secondary | ICD-10-CM | POA: Insufficient documentation

## 2024-06-18 DIAGNOSIS — Z794 Long term (current) use of insulin: Secondary | ICD-10-CM | POA: Diagnosis not present

## 2024-06-18 NOTE — Patient Instructions (Signed)
 Meal prep using the plate planner to plan ahead   Maintain physical activity minutes weekly

## 2024-06-21 DIAGNOSIS — M47816 Spondylosis without myelopathy or radiculopathy, lumbar region: Secondary | ICD-10-CM | POA: Diagnosis not present

## 2024-06-21 DIAGNOSIS — M9902 Segmental and somatic dysfunction of thoracic region: Secondary | ICD-10-CM | POA: Diagnosis not present

## 2024-06-21 DIAGNOSIS — M9903 Segmental and somatic dysfunction of lumbar region: Secondary | ICD-10-CM | POA: Diagnosis not present

## 2024-06-21 DIAGNOSIS — M9901 Segmental and somatic dysfunction of cervical region: Secondary | ICD-10-CM | POA: Diagnosis not present

## 2024-06-21 DIAGNOSIS — M9905 Segmental and somatic dysfunction of pelvic region: Secondary | ICD-10-CM | POA: Diagnosis not present

## 2024-06-21 DIAGNOSIS — M4723 Other spondylosis with radiculopathy, cervicothoracic region: Secondary | ICD-10-CM | POA: Diagnosis not present

## 2024-06-21 DIAGNOSIS — M25551 Pain in right hip: Secondary | ICD-10-CM | POA: Diagnosis not present

## 2024-06-22 DIAGNOSIS — F411 Generalized anxiety disorder: Secondary | ICD-10-CM | POA: Diagnosis not present

## 2024-06-23 DIAGNOSIS — M9902 Segmental and somatic dysfunction of thoracic region: Secondary | ICD-10-CM | POA: Diagnosis not present

## 2024-06-23 DIAGNOSIS — M9901 Segmental and somatic dysfunction of cervical region: Secondary | ICD-10-CM | POA: Diagnosis not present

## 2024-06-23 DIAGNOSIS — M4723 Other spondylosis with radiculopathy, cervicothoracic region: Secondary | ICD-10-CM | POA: Diagnosis not present

## 2024-06-23 DIAGNOSIS — M9905 Segmental and somatic dysfunction of pelvic region: Secondary | ICD-10-CM | POA: Diagnosis not present

## 2024-06-23 DIAGNOSIS — M25551 Pain in right hip: Secondary | ICD-10-CM | POA: Diagnosis not present

## 2024-06-23 DIAGNOSIS — M47816 Spondylosis without myelopathy or radiculopathy, lumbar region: Secondary | ICD-10-CM | POA: Diagnosis not present

## 2024-06-23 DIAGNOSIS — M9903 Segmental and somatic dysfunction of lumbar region: Secondary | ICD-10-CM | POA: Diagnosis not present

## 2024-06-28 DIAGNOSIS — M25551 Pain in right hip: Secondary | ICD-10-CM | POA: Diagnosis not present

## 2024-06-28 DIAGNOSIS — M47816 Spondylosis without myelopathy or radiculopathy, lumbar region: Secondary | ICD-10-CM | POA: Diagnosis not present

## 2024-06-28 DIAGNOSIS — M9905 Segmental and somatic dysfunction of pelvic region: Secondary | ICD-10-CM | POA: Diagnosis not present

## 2024-06-28 DIAGNOSIS — M9902 Segmental and somatic dysfunction of thoracic region: Secondary | ICD-10-CM | POA: Diagnosis not present

## 2024-06-28 DIAGNOSIS — M9901 Segmental and somatic dysfunction of cervical region: Secondary | ICD-10-CM | POA: Diagnosis not present

## 2024-06-28 DIAGNOSIS — M4723 Other spondylosis with radiculopathy, cervicothoracic region: Secondary | ICD-10-CM | POA: Diagnosis not present

## 2024-06-28 DIAGNOSIS — M9903 Segmental and somatic dysfunction of lumbar region: Secondary | ICD-10-CM | POA: Diagnosis not present

## 2024-07-01 DIAGNOSIS — F411 Generalized anxiety disorder: Secondary | ICD-10-CM | POA: Diagnosis not present

## 2024-07-05 DIAGNOSIS — M9903 Segmental and somatic dysfunction of lumbar region: Secondary | ICD-10-CM | POA: Diagnosis not present

## 2024-07-05 DIAGNOSIS — M9905 Segmental and somatic dysfunction of pelvic region: Secondary | ICD-10-CM | POA: Diagnosis not present

## 2024-07-05 DIAGNOSIS — M4723 Other spondylosis with radiculopathy, cervicothoracic region: Secondary | ICD-10-CM | POA: Diagnosis not present

## 2024-07-05 DIAGNOSIS — M47816 Spondylosis without myelopathy or radiculopathy, lumbar region: Secondary | ICD-10-CM | POA: Diagnosis not present

## 2024-07-05 DIAGNOSIS — M9901 Segmental and somatic dysfunction of cervical region: Secondary | ICD-10-CM | POA: Diagnosis not present

## 2024-07-05 DIAGNOSIS — M9902 Segmental and somatic dysfunction of thoracic region: Secondary | ICD-10-CM | POA: Diagnosis not present

## 2024-07-05 DIAGNOSIS — M25551 Pain in right hip: Secondary | ICD-10-CM | POA: Diagnosis not present

## 2024-07-07 DIAGNOSIS — M9903 Segmental and somatic dysfunction of lumbar region: Secondary | ICD-10-CM | POA: Diagnosis not present

## 2024-07-07 DIAGNOSIS — M9905 Segmental and somatic dysfunction of pelvic region: Secondary | ICD-10-CM | POA: Diagnosis not present

## 2024-07-07 DIAGNOSIS — M25551 Pain in right hip: Secondary | ICD-10-CM | POA: Diagnosis not present

## 2024-07-07 DIAGNOSIS — M47816 Spondylosis without myelopathy or radiculopathy, lumbar region: Secondary | ICD-10-CM | POA: Diagnosis not present

## 2024-07-07 DIAGNOSIS — M9902 Segmental and somatic dysfunction of thoracic region: Secondary | ICD-10-CM | POA: Diagnosis not present

## 2024-07-07 DIAGNOSIS — M9901 Segmental and somatic dysfunction of cervical region: Secondary | ICD-10-CM | POA: Diagnosis not present

## 2024-07-07 DIAGNOSIS — M4723 Other spondylosis with radiculopathy, cervicothoracic region: Secondary | ICD-10-CM | POA: Diagnosis not present

## 2024-07-12 DIAGNOSIS — M9903 Segmental and somatic dysfunction of lumbar region: Secondary | ICD-10-CM | POA: Diagnosis not present

## 2024-07-12 DIAGNOSIS — M9901 Segmental and somatic dysfunction of cervical region: Secondary | ICD-10-CM | POA: Diagnosis not present

## 2024-07-12 DIAGNOSIS — M25551 Pain in right hip: Secondary | ICD-10-CM | POA: Diagnosis not present

## 2024-07-12 DIAGNOSIS — M47816 Spondylosis without myelopathy or radiculopathy, lumbar region: Secondary | ICD-10-CM | POA: Diagnosis not present

## 2024-07-12 DIAGNOSIS — M4723 Other spondylosis with radiculopathy, cervicothoracic region: Secondary | ICD-10-CM | POA: Diagnosis not present

## 2024-07-12 DIAGNOSIS — M9905 Segmental and somatic dysfunction of pelvic region: Secondary | ICD-10-CM | POA: Diagnosis not present

## 2024-07-12 DIAGNOSIS — M9902 Segmental and somatic dysfunction of thoracic region: Secondary | ICD-10-CM | POA: Diagnosis not present

## 2024-07-13 DIAGNOSIS — F411 Generalized anxiety disorder: Secondary | ICD-10-CM | POA: Diagnosis not present

## 2024-07-15 DIAGNOSIS — M9905 Segmental and somatic dysfunction of pelvic region: Secondary | ICD-10-CM | POA: Diagnosis not present

## 2024-07-15 DIAGNOSIS — M9902 Segmental and somatic dysfunction of thoracic region: Secondary | ICD-10-CM | POA: Diagnosis not present

## 2024-07-15 DIAGNOSIS — M47816 Spondylosis without myelopathy or radiculopathy, lumbar region: Secondary | ICD-10-CM | POA: Diagnosis not present

## 2024-07-15 DIAGNOSIS — M9903 Segmental and somatic dysfunction of lumbar region: Secondary | ICD-10-CM | POA: Diagnosis not present

## 2024-07-15 DIAGNOSIS — M25551 Pain in right hip: Secondary | ICD-10-CM | POA: Diagnosis not present

## 2024-07-15 DIAGNOSIS — M9901 Segmental and somatic dysfunction of cervical region: Secondary | ICD-10-CM | POA: Diagnosis not present

## 2024-07-15 DIAGNOSIS — M4723 Other spondylosis with radiculopathy, cervicothoracic region: Secondary | ICD-10-CM | POA: Diagnosis not present

## 2024-07-20 DIAGNOSIS — M9901 Segmental and somatic dysfunction of cervical region: Secondary | ICD-10-CM | POA: Diagnosis not present

## 2024-07-20 DIAGNOSIS — M47816 Spondylosis without myelopathy or radiculopathy, lumbar region: Secondary | ICD-10-CM | POA: Diagnosis not present

## 2024-07-20 DIAGNOSIS — M4723 Other spondylosis with radiculopathy, cervicothoracic region: Secondary | ICD-10-CM | POA: Diagnosis not present

## 2024-07-20 DIAGNOSIS — M9905 Segmental and somatic dysfunction of pelvic region: Secondary | ICD-10-CM | POA: Diagnosis not present

## 2024-07-20 DIAGNOSIS — M9903 Segmental and somatic dysfunction of lumbar region: Secondary | ICD-10-CM | POA: Diagnosis not present

## 2024-07-20 DIAGNOSIS — M9902 Segmental and somatic dysfunction of thoracic region: Secondary | ICD-10-CM | POA: Diagnosis not present

## 2024-07-20 DIAGNOSIS — M25551 Pain in right hip: Secondary | ICD-10-CM | POA: Diagnosis not present

## 2024-07-21 DIAGNOSIS — M9901 Segmental and somatic dysfunction of cervical region: Secondary | ICD-10-CM | POA: Diagnosis not present

## 2024-07-21 DIAGNOSIS — M9903 Segmental and somatic dysfunction of lumbar region: Secondary | ICD-10-CM | POA: Diagnosis not present

## 2024-07-21 DIAGNOSIS — M4723 Other spondylosis with radiculopathy, cervicothoracic region: Secondary | ICD-10-CM | POA: Diagnosis not present

## 2024-07-21 DIAGNOSIS — M9902 Segmental and somatic dysfunction of thoracic region: Secondary | ICD-10-CM | POA: Diagnosis not present

## 2024-07-21 DIAGNOSIS — M25551 Pain in right hip: Secondary | ICD-10-CM | POA: Diagnosis not present

## 2024-07-21 DIAGNOSIS — M47816 Spondylosis without myelopathy or radiculopathy, lumbar region: Secondary | ICD-10-CM | POA: Diagnosis not present

## 2024-07-21 DIAGNOSIS — M9905 Segmental and somatic dysfunction of pelvic region: Secondary | ICD-10-CM | POA: Diagnosis not present

## 2024-07-26 DIAGNOSIS — M4723 Other spondylosis with radiculopathy, cervicothoracic region: Secondary | ICD-10-CM | POA: Diagnosis not present

## 2024-07-26 DIAGNOSIS — M9905 Segmental and somatic dysfunction of pelvic region: Secondary | ICD-10-CM | POA: Diagnosis not present

## 2024-07-26 DIAGNOSIS — M47816 Spondylosis without myelopathy or radiculopathy, lumbar region: Secondary | ICD-10-CM | POA: Diagnosis not present

## 2024-07-26 DIAGNOSIS — M9903 Segmental and somatic dysfunction of lumbar region: Secondary | ICD-10-CM | POA: Diagnosis not present

## 2024-07-26 DIAGNOSIS — M9901 Segmental and somatic dysfunction of cervical region: Secondary | ICD-10-CM | POA: Diagnosis not present

## 2024-07-26 DIAGNOSIS — M25551 Pain in right hip: Secondary | ICD-10-CM | POA: Diagnosis not present

## 2024-07-26 DIAGNOSIS — M9902 Segmental and somatic dysfunction of thoracic region: Secondary | ICD-10-CM | POA: Diagnosis not present

## 2024-07-27 DIAGNOSIS — M9901 Segmental and somatic dysfunction of cervical region: Secondary | ICD-10-CM | POA: Diagnosis not present

## 2024-07-27 DIAGNOSIS — M9903 Segmental and somatic dysfunction of lumbar region: Secondary | ICD-10-CM | POA: Diagnosis not present

## 2024-07-27 DIAGNOSIS — M9902 Segmental and somatic dysfunction of thoracic region: Secondary | ICD-10-CM | POA: Diagnosis not present

## 2024-07-27 DIAGNOSIS — M9905 Segmental and somatic dysfunction of pelvic region: Secondary | ICD-10-CM | POA: Diagnosis not present

## 2024-07-27 DIAGNOSIS — M47816 Spondylosis without myelopathy or radiculopathy, lumbar region: Secondary | ICD-10-CM | POA: Diagnosis not present

## 2024-07-27 DIAGNOSIS — M25551 Pain in right hip: Secondary | ICD-10-CM | POA: Diagnosis not present

## 2024-07-27 DIAGNOSIS — M4723 Other spondylosis with radiculopathy, cervicothoracic region: Secondary | ICD-10-CM | POA: Diagnosis not present

## 2024-07-29 DIAGNOSIS — F411 Generalized anxiety disorder: Secondary | ICD-10-CM | POA: Diagnosis not present

## 2024-08-02 DIAGNOSIS — M9903 Segmental and somatic dysfunction of lumbar region: Secondary | ICD-10-CM | POA: Diagnosis not present

## 2024-08-02 DIAGNOSIS — M9901 Segmental and somatic dysfunction of cervical region: Secondary | ICD-10-CM | POA: Diagnosis not present

## 2024-08-02 DIAGNOSIS — M9902 Segmental and somatic dysfunction of thoracic region: Secondary | ICD-10-CM | POA: Diagnosis not present

## 2024-08-02 DIAGNOSIS — M25551 Pain in right hip: Secondary | ICD-10-CM | POA: Diagnosis not present

## 2024-08-02 DIAGNOSIS — M9905 Segmental and somatic dysfunction of pelvic region: Secondary | ICD-10-CM | POA: Diagnosis not present

## 2024-08-02 DIAGNOSIS — M4723 Other spondylosis with radiculopathy, cervicothoracic region: Secondary | ICD-10-CM | POA: Diagnosis not present

## 2024-08-02 DIAGNOSIS — M47816 Spondylosis without myelopathy or radiculopathy, lumbar region: Secondary | ICD-10-CM | POA: Diagnosis not present

## 2024-08-05 DIAGNOSIS — F411 Generalized anxiety disorder: Secondary | ICD-10-CM | POA: Diagnosis not present

## 2024-08-09 DIAGNOSIS — M9902 Segmental and somatic dysfunction of thoracic region: Secondary | ICD-10-CM | POA: Diagnosis not present

## 2024-08-09 DIAGNOSIS — M47816 Spondylosis without myelopathy or radiculopathy, lumbar region: Secondary | ICD-10-CM | POA: Diagnosis not present

## 2024-08-09 DIAGNOSIS — M9905 Segmental and somatic dysfunction of pelvic region: Secondary | ICD-10-CM | POA: Diagnosis not present

## 2024-08-09 DIAGNOSIS — M25551 Pain in right hip: Secondary | ICD-10-CM | POA: Diagnosis not present

## 2024-08-09 DIAGNOSIS — M4723 Other spondylosis with radiculopathy, cervicothoracic region: Secondary | ICD-10-CM | POA: Diagnosis not present

## 2024-08-09 DIAGNOSIS — M9903 Segmental and somatic dysfunction of lumbar region: Secondary | ICD-10-CM | POA: Diagnosis not present

## 2024-08-09 DIAGNOSIS — M9901 Segmental and somatic dysfunction of cervical region: Secondary | ICD-10-CM | POA: Diagnosis not present

## 2024-08-10 DIAGNOSIS — F411 Generalized anxiety disorder: Secondary | ICD-10-CM | POA: Diagnosis not present

## 2024-08-25 DIAGNOSIS — F411 Generalized anxiety disorder: Secondary | ICD-10-CM | POA: Diagnosis not present

## 2024-08-26 DIAGNOSIS — M9901 Segmental and somatic dysfunction of cervical region: Secondary | ICD-10-CM | POA: Diagnosis not present

## 2024-08-26 DIAGNOSIS — M25551 Pain in right hip: Secondary | ICD-10-CM | POA: Diagnosis not present

## 2024-08-26 DIAGNOSIS — M9905 Segmental and somatic dysfunction of pelvic region: Secondary | ICD-10-CM | POA: Diagnosis not present

## 2024-08-26 DIAGNOSIS — M9903 Segmental and somatic dysfunction of lumbar region: Secondary | ICD-10-CM | POA: Diagnosis not present

## 2024-08-26 DIAGNOSIS — M9902 Segmental and somatic dysfunction of thoracic region: Secondary | ICD-10-CM | POA: Diagnosis not present

## 2024-08-26 DIAGNOSIS — M4723 Other spondylosis with radiculopathy, cervicothoracic region: Secondary | ICD-10-CM | POA: Diagnosis not present

## 2024-08-26 DIAGNOSIS — M47816 Spondylosis without myelopathy or radiculopathy, lumbar region: Secondary | ICD-10-CM | POA: Diagnosis not present

## 2024-09-03 ENCOUNTER — Ambulatory Visit: Payer: Self-pay | Admitting: Family

## 2024-09-03 DIAGNOSIS — F411 Generalized anxiety disorder: Secondary | ICD-10-CM | POA: Diagnosis not present

## 2024-09-07 DIAGNOSIS — F411 Generalized anxiety disorder: Secondary | ICD-10-CM | POA: Diagnosis not present

## 2024-09-14 ENCOUNTER — Ambulatory Visit (INDEPENDENT_AMBULATORY_CARE_PROVIDER_SITE_OTHER): Admitting: Family

## 2024-09-14 ENCOUNTER — Other Ambulatory Visit: Payer: Self-pay | Admitting: Family

## 2024-09-14 VITALS — BP 134/88 | HR 90 | Temp 98.1°F | Resp 16 | Ht 64.0 in | Wt 351.6 lb

## 2024-09-14 DIAGNOSIS — E1165 Type 2 diabetes mellitus with hyperglycemia: Secondary | ICD-10-CM

## 2024-09-14 DIAGNOSIS — E119 Type 2 diabetes mellitus without complications: Secondary | ICD-10-CM

## 2024-09-14 DIAGNOSIS — Z13228 Encounter for screening for other metabolic disorders: Secondary | ICD-10-CM

## 2024-09-14 DIAGNOSIS — Z7985 Long-term (current) use of injectable non-insulin antidiabetic drugs: Secondary | ICD-10-CM

## 2024-09-14 MED ORDER — SEMAGLUTIDE(0.25 OR 0.5MG/DOS) 2 MG/1.5ML ~~LOC~~ SOPN: 0.2500 mg | PEN_INJECTOR | SUBCUTANEOUS | 0 refills | Status: AC

## 2024-09-14 NOTE — Progress Notes (Signed)
 6 month follow up, concern about weight

## 2024-09-14 NOTE — Progress Notes (Signed)
 Patient ID: Gloria Boyd, female    DOB: 08-Sep-1991  MRN: [id number]  CC: Chronic Conditions Follow-Up  Subjective: Gloria Boyd is a 33 y.o. female who presents for chronic conditions follow-up.   Her concerns today include:  - States she has not taken Semaglutide  in 6 months due to health insurance would not cover cost. States Metformin, Dulaglutide , and oral Srmaglutide were also declined by health insurance. States she will have a new health insurance plan soon. She is watching what she eats. Reports she is established with weight specialist/nutritionist. She does not exercise outside of normal routine. Denies red flag symptoms associated with diabetes.  - States up to date on diabetic eye exam. - States up to date on diabetic foot exam.  - Requests lab for blood type.   Patient Active Problem List   Diagnosis Date Noted   Uncontrolled type 2 diabetes mellitus with hyperglycemia (HCC) 10/07/2023     Current Outpatient Medications on File Prior to Visit  Medication Sig Dispense Refill   albuterol  (VENTOLIN  HFA) 108 (90 Base) MCG/ACT inhaler Inhale into the lungs.     Ascorbic Acid (VITAMIN C WITH ROSE HIPS) 500 MG tablet Take 500 mg by mouth daily.     Blood Glucose Monitoring Suppl (ONETOUCH VERIO FLEX SYSTEM) w/Device KIT by Does not apply route.     glucose blood (ONETOUCH VERIO) test strip Use to check blood sugar three times 300 each 3   OneTouch Delica Lancets 33G MISC Use to check blood sugar three times 300 each 3   Continuous Glucose Receiver (FREESTYLE LIBRE 3 READER) DEVI by Does not apply route. (Patient not taking: Reported on 04/21/2024)     Continuous Glucose Sensor (FREESTYLE LIBRE 3 SENSOR) MISC Place onto the skin. (Patient not taking: Reported on 04/21/2024)     Dulaglutide  (TRULICITY ) 0.75 MG/0.5ML SOAJ Inject 0.75 mg into the skin once a week. (Patient not taking: Reported on 04/21/2024) 2 mL 1   Semaglutide , 2 MG/DOSE, 8 MG/3ML SOPN Inject 2 mg as directed  once a week. (Patient not taking: Reported on 04/21/2024) 3 mL 2   Vitamin D, Ergocalciferol, (DRISDOL) 1.25 MG (50000 UNIT) CAPS capsule Take by mouth. (Patient not taking: Reported on 09/14/2024)     No current facility-administered medications on file prior to visit.    Allergies  Allergen Reactions   Lactose Intolerance (Gi) Diarrhea    Social History   Socioeconomic History   Marital status: Single    Spouse name: Not on file   Number of children: Not on file   Years of education: Not on file   Highest education level: Bachelor's degree (e.g., BA, AB, BS)  Occupational History   Not on file  Tobacco Use   Smoking status: Never   Smokeless tobacco: Never  Vaping Use   Vaping status: Never Used  Substance and Sexual Activity   Alcohol use: Yes   Drug use: Never   Sexual activity: Yes    Birth control/protection: Condom, None  Other Topics Concern   Not on file  Social History Narrative   Not on file   Social Drivers of Health   Financial Resource Strain: Low Risk  (09/14/2024)   Overall Financial Resource Strain (CARDIA)    Difficulty of Paying Living Expenses: Not very hard  Food Insecurity: No Food Insecurity (09/14/2024)   Hunger Vital Sign    Worried About Running Out of Food in the Last Year: Never true    Ran Out of Food in  the Last Year: Never true  Transportation Needs: No Transportation Needs (09/14/2024)   PRAPARE - Administrator, Civil Service (Medical): No    Lack of Transportation (Non-Medical): No  Physical Activity: Insufficiently Active (09/14/2024)   Exercise Vital Sign    Days of Exercise per Week: 2 days    Minutes of Exercise per Session: 10 min  Stress: No Stress Concern Present (09/14/2024)   [employer] of Occupational Health - Occupational Stress Questionnaire    Feeling of Stress: Only a little  Social Connections: Socially Integrated (09/14/2024)   Social Connection and Isolation Panel    Frequency of Communication  with Friends and Family: More than three times a week    Frequency of Social Gatherings with Friends and Family: More than three times a week    Attends Religious Services: 1 to 4 times per year    Active Member of [employer] or Organizations: Yes    Attends Banker Meetings: Not on file    Marital Status: Living with partner  Intimate Partner Violence: Not At Risk (11/24/2023)   Received from [facility]   HITS    Over the last 12 months how often did your partner physically hurt you?: Never    Over the last 12 months how often did your partner insult you or talk down to you?: Never    Over the last 12 months how often did your partner threaten you with physical harm?: Never    Over the last 12 months how often did your partner scream or curse at you?: Never    Family History  Problem Relation Age of Onset   Healthy Mother    Healthy Father     History reviewed. No pertinent surgical history.  ROS: Review of Systems Negative except as stated above  PHYSICAL EXAM: BP 134/88   Pulse 90   Temp 98.1 F (36.7 C) (Oral)   Resp 16   Ht 5' 4 (1.626 m)   Wt (!) 351 lb 9.6 oz (159.5 kg)   LMP 08/20/2024 (Exact Date)   SpO2 97%   BMI 60.35 kg/m   Physical Exam HENT:     Head: Normocephalic and atraumatic.     Nose: Nose normal.     Mouth/Throat:     Mouth: Mucous membranes are moist.     Pharynx: Oropharynx is clear.  Eyes:     Extraocular Movements: Extraocular movements intact.     Conjunctiva/sclera: Conjunctivae normal.     Pupils: Pupils are equal, round, and reactive to light.  Cardiovascular:     Rate and Rhythm: Normal rate and regular rhythm.     Pulses: Normal pulses.     Heart sounds: Normal heart sounds.  Pulmonary:     Effort: Pulmonary effort is normal.     Breath sounds: Normal breath sounds.  Musculoskeletal:        General: Normal range of motion.     Cervical back: Normal range of motion and neck supple.  Neurological:     General: No  focal deficit present.     Mental Status: She is alert and oriented to person, place, and time.  Psychiatric:        Mood and Affect: Mood normal.        Behavior: Behavior normal.     ASSESSMENT AND PLAN: 1. Type 2 diabetes mellitus with hyperglycemia, without long-term current use of insulin (HCC) (Primary) - Hemoglobin A1c result pending. - Semaglutide  as prescribed.  -  Routine screening.  - Discussed the importance of healthy eating habits, low-carbohydrate diet, low-sugar diet, regular aerobic exercise (at least 150 minutes a week as tolerated) and medication compliance to achieve or maintain control of diabetes. Counseled on medication adherence/adverse effects.  - Follow-up with primary provider in 4 weeks or sooner if needed.  - Semaglutide ,0.25 or 0.5MG /DOS, 2 MG/1.5ML SOPN; Inject 0.25 mg into the skin once a week.  Dispense: 6 mL; Refill: 0 - Hemoglobin A1c - Basic Metabolic Panel  2. Diabetic eye exam [facility]) - Patient states up to date on diabetic eye exam.  3. Encounter for diabetic foot exam [facility]) - Patient states up to date on diabetic foot exam.  4. Screening for metabolic disorder - Routine screening.  - ABO/Rh   Patient was given the opportunity to ask questions.  Patient verbalized understanding of the plan and was able to repeat key elements of the plan. Patient was given clear instructions to go to Emergency Department or return to medical center if symptoms don't improve, worsen, or new problems develop.The patient verbalized understanding.   Orders Placed This Encounter  Procedures   Hemoglobin A1c   Basic Metabolic Panel   ABO/Rh     Requested Prescriptions   Signed Prescriptions Disp Refills   Semaglutide ,0.25 or 0.5MG /DOS, 2 MG/1.5ML SOPN 6 mL 0    Sig: Inject 0.25 mg into the skin once a week.    Return in 4 weeks (on 10/12/2024) for Follow-Up or next available with Raguel Blush, MD.  Greig JINNY Drones, NP

## 2024-09-15 ENCOUNTER — Ambulatory Visit: Payer: Self-pay | Admitting: Family

## 2024-09-15 DIAGNOSIS — F411 Generalized anxiety disorder: Secondary | ICD-10-CM | POA: Diagnosis not present

## 2024-09-15 LAB — HEMOGLOBIN A1C
Est. average glucose Bld gHb Est-mCnc: 229 mg/dL
Hgb A1c MFr Bld: 9.6 % — ABNORMAL HIGH (ref 4.8–5.6)

## 2024-09-15 LAB — BASIC METABOLIC PANEL WITH GFR
BUN/Creatinine Ratio: 14 (ref 9–23)
BUN: 12 mg/dL (ref 6–20)
CO2: 23 mmol/L (ref 20–29)
Calcium: 10.2 mg/dL (ref 8.7–10.2)
Chloride: 96 mmol/L (ref 96–106)
Creatinine, Ser: 0.88 mg/dL (ref 0.57–1.00)
Glucose: 143 mg/dL — ABNORMAL HIGH (ref 70–99)
Potassium: 5.5 mmol/L — ABNORMAL HIGH (ref 3.5–5.2)
Sodium: 136 mmol/L (ref 134–144)
eGFR: 89 mL/min/1.73 (ref >59)

## 2024-09-15 LAB — ABO/RH: Rh Factor: POSITIVE

## 2024-09-15 NOTE — Progress Notes (Deleted)
  Diabetes Self-Management Education  Visit Type:     Appt. Start Time: *** Appt. End Time: ***  09/20/2024  [name], identified by name and date of birth, is a 33 y.o. female with a diagnosis of Diabetes:  .     ASSESSMENT *** Patient is here today alone for follow up.    Pt reports GLP not covered with insurance.   Pt reports her appetite is increased and feels hungry about an hour after meals.   Pt reports she has made the following changes including reading nutrition labels and increasing intake of non starchy vegetables.   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   Pt reports she continues with the plate planner.  Pt reports meal schedule changed over the summer and has begun skipping breakfast.  Pt reports she has she has increase physical activity using videos,Yoga  and walk the dog achieving 200+ minutes weekly.  Pt reports she last checked her blood sugar at fasting yesterday with a value obtained of 100 mg/dL.  Pt denies using the scale to monitor her body weight stating negative feelings can occur.  All Pt's questions were answered during this encounter.    Lab Results  Component Value Date   HGBA1C 9.6 (H) 09/14/2024   Lab Results  Component Value Date   CHOL 189 03/12/2024   HDL 52 03/12/2024   LDLCALC 116 (H) 03/12/2024   TRIG 116 03/12/2024   CHOLHDL 3.6 03/12/2024   Wt Readings from Last 3 Encounters:  09/14/24 (!) 351 lb 9.6 oz (159.5 kg)  03/03/24 (!) 337 lb 12.8 oz (153.2 kg)       Learning Objective:  Patient will have a greater understanding of diabetes self-management. Patient education plan is to attend individual and/or group sessions per assessed needs and concerns.   Plan:   There are no Patient Instructions on file for this visit.   Expected Outcomes:     Education material provided: Meal plan  card  If problems or questions, patient to contact team via:  Phone  Future DSME appointment: -

## 2024-09-24 ENCOUNTER — Ambulatory Visit: Admitting: Dietician

## 2024-09-24 DIAGNOSIS — E1165 Type 2 diabetes mellitus with hyperglycemia: Secondary | ICD-10-CM

## 2024-10-18 ENCOUNTER — Telehealth: Payer: Self-pay

## 2024-10-18 NOTE — Telephone Encounter (Signed)
 Can be discussed with Dr. Tanda at upcoming appt 11/01/24

## 2024-10-18 NOTE — Telephone Encounter (Signed)
 Copied from CRM #[id number]. Topic: General - Other >> Oct 18, 2024 10:21 AM Nessti S wrote: Reason for CRM: pt called in because she has new insurance with [employer] which will cover Mounjaro . She will like to restart the process over with her updated insurance. Call back number [phone]   ----------------------------------------------------------------------- From previous Reason for Contact - Prescription Issue: Reason for CRM:

## 2024-10-19 ENCOUNTER — Telehealth: Payer: Self-pay | Admitting: Family Medicine

## 2024-10-19 NOTE — Telephone Encounter (Unsigned)
 Copied from CRM #[id number]. Topic: Clinical - Medication Refill >> Oct 19, 2024 12:54 PM Lonell PEDLAR wrote: Medication: Semaglutide , 2 MG/DOSE, 8 MG/3ML SOPN, Mounjaro   Has the patient contacted their pharmacy? Yes, advised to contact PCP, PA required   This is the patient's preferred pharmacy:  [id number] [address], [place] - [address] [facility] [address] Phone: [phone] Fax: [phone]  Is this the correct pharmacy for this prescription? Yes If no, delete pharmacy and type the correct one.   Has the prescription been filled recently? Yes  Is the patient out of the medication? Yes  Has the patient been seen for an appointment in the last year OR does the patient have an upcoming appointment? Yes  Can we respond through MyChart? Yes  Agent: Please be advised that Rx refills may take up to 3 business days. We ask that you follow-up with your pharmacy.

## 2024-10-29 ENCOUNTER — Telehealth: Payer: Self-pay

## 2024-10-29 ENCOUNTER — Other Ambulatory Visit: Payer: Self-pay | Admitting: Family Medicine

## 2024-10-29 NOTE — Telephone Encounter (Signed)
 Tried to call pt to verify dose - no answer unable to LVM

## 2024-10-29 NOTE — Telephone Encounter (Signed)
 Copied from CRM #[id number]. Topic: Clinical - Medication Prior Auth >> Oct 29, 2024 10:27 AM Alexandria E wrote: Reason for CRM: Patient started on new insurance and a prior authorization is needed for her Semaglutide , 2 MG/DOSE, 8 MG/3ML SOPN. Please call patient when PA is submitted for updates.

## 2024-11-01 ENCOUNTER — Other Ambulatory Visit: Payer: Self-pay

## 2024-11-01 ENCOUNTER — Ambulatory Visit: Admitting: Family Medicine

## 2024-11-03 ENCOUNTER — Other Ambulatory Visit: Payer: Self-pay

## 2024-11-03 ENCOUNTER — Telehealth: Payer: Self-pay

## 2024-11-03 NOTE — Telephone Encounter (Signed)
 Pharmacy Patient Advocate Encounter  Received notification from [facility] that Prior Authorization for OZEMPIC  has been APPROVED from 11/01/2024 to 11/02/2027   PA #/Case ID/Reference #: [id number]

## 2024-11-08 ENCOUNTER — Telehealth: Payer: Self-pay

## 2024-11-08 NOTE — Telephone Encounter (Signed)
 Error

## 2024-11-20 ENCOUNTER — Other Ambulatory Visit: Payer: Self-pay

## 2024-11-22 ENCOUNTER — Telehealth: Payer: Self-pay | Admitting: Family Medicine

## 2024-11-22 ENCOUNTER — Other Ambulatory Visit: Payer: Self-pay

## 2024-11-22 NOTE — Telephone Encounter (Signed)
 Copied from CRM [id number]. Topic: Clinical - Medication Prior Auth >> Nov 22, 2024 12:21 PM Rexford HERO wrote: Reason for CRM: Patient's insurance changed and she now needs a PA for Mounjaro . Patient stated that her Pharmacy sent in a request already. They are requesting for this to processed as soon as possible.

## 2024-11-22 NOTE — Telephone Encounter (Unsigned)
 Copied from CRM #[id number]. Topic: Clinical - Medication Prior Auth >> Nov 22, 2024  2:46 PM Gloria Boyd wrote: Reason for CRM: Patient called say needs PA for Monjauro, because the carrier for the drug changed [facility]. She is requesting for this to be done asap

## 2024-11-29 ENCOUNTER — Other Ambulatory Visit: Payer: Self-pay | Admitting: Family Medicine

## 2024-11-29 ENCOUNTER — Telehealth: Payer: Self-pay

## 2024-11-29 MED ORDER — TIRZEPATIDE 2.5 MG/0.5ML ~~LOC~~ SOAJ: 2.5000 mg | SUBCUTANEOUS | 2 refills | Status: AC

## 2024-11-29 NOTE — Telephone Encounter (Signed)
 Copied from CRM #[id number]. Topic: Clinical - Medication Prior Auth >> Nov 29, 2024 10:25 AM Tinnie BROCKS wrote: Reason for CRM: Pt calling back regarding Ozempic  PA being sent in rather than the requested Mounjaro . Last note on request from 1/5. She is not happy with the turnaround time in getting this fixed because her diabetes is not being managed currently and would like a call back as soon as possible. #[phone]

## 2024-12-01 ENCOUNTER — Telehealth: Payer: Self-pay

## 2024-12-01 NOTE — Telephone Encounter (Signed)
 Patient insurance is inactive, per [employer]

## 2024-12-01 NOTE — Telephone Encounter (Signed)
 Pharmacy Patient Advocate Encounter   Received notification from Physician's Office that prior authorization for MOUNJARO  is required/requested.   Insurance verification completed.   The patient is insured through [facility].   Per test claim: PA required; PA submitted to above mentioned insurance via CoverMyMeds Key/confirmation #/EOC [id number] Status is pending

## 2024-12-03 ENCOUNTER — Other Ambulatory Visit: Payer: Self-pay

## 2024-12-03 NOTE — Telephone Encounter (Signed)
 Pharmacy Patient Advocate Encounter  Received notification from [employer] that Prior Authorization for [employer]  has been DENIED.  Full denial letter will be uploaded to the media tab. See denial reason below.

## 2024-12-09 ENCOUNTER — Other Ambulatory Visit: Payer: Self-pay

## 2024-12-09 ENCOUNTER — Telehealth: Payer: Self-pay

## 2024-12-09 NOTE — Telephone Encounter (Signed)
 Pharmacy Patient Advocate Encounter  Received notification from [employer] that Prior Authorization for [employer]  has been APPROVED from 12/08/2024 to 11/17/2038

## 2024-12-22 ENCOUNTER — Ambulatory Visit: Admitting: Family Medicine
